# Patient Record
Sex: Female | Born: 1960 | Race: White | Hispanic: No | Smoking: Never smoker
Health system: Southern US, Community
[De-identification: ages and names within clinical notes are randomized; demographics above are authoritative.]

## PROBLEM LIST (undated history)

## (undated) DIAGNOSIS — G43909 Migraine, unspecified, not intractable, without status migrainosus: Secondary | ICD-10-CM

## (undated) DIAGNOSIS — C4491 Basal cell carcinoma of skin, unspecified: Secondary | ICD-10-CM

## (undated) DIAGNOSIS — Z8669 Personal history of other diseases of the nervous system and sense organs: Secondary | ICD-10-CM

## (undated) DIAGNOSIS — E7800 Pure hypercholesterolemia, unspecified: Secondary | ICD-10-CM

## (undated) DIAGNOSIS — B019 Varicella without complication: Secondary | ICD-10-CM

## (undated) DIAGNOSIS — M1990 Unspecified osteoarthritis, unspecified site: Secondary | ICD-10-CM

## (undated) DIAGNOSIS — J309 Allergic rhinitis, unspecified: Secondary | ICD-10-CM

## (undated) DIAGNOSIS — J329 Chronic sinusitis, unspecified: Secondary | ICD-10-CM

## (undated) DIAGNOSIS — T7840XA Allergy, unspecified, initial encounter: Secondary | ICD-10-CM

## (undated) DIAGNOSIS — R569 Unspecified convulsions: Secondary | ICD-10-CM

## (undated) HISTORY — DX: Personal history of other diseases of the nervous system and sense organs: Z86.69

## (undated) HISTORY — DX: Basal cell carcinoma of skin, unspecified: C44.91

## (undated) HISTORY — DX: Unspecified osteoarthritis, unspecified site: M19.90

## (undated) HISTORY — DX: Migraine, unspecified, not intractable, without status migrainosus: G43.909

## (undated) HISTORY — PX: BREAST SURGERY: SHX581

## (undated) HISTORY — DX: Pure hypercholesterolemia, unspecified: E78.00

## (undated) HISTORY — DX: Varicella without complication: B01.9

## (undated) HISTORY — PX: TOTAL ABDOMINAL HYSTERECTOMY W/ BILATERAL SALPINGOOPHORECTOMY: SHX83

## (undated) HISTORY — DX: Allergic rhinitis, unspecified: J30.9

## (undated) HISTORY — PX: ABDOMINAL HYSTERECTOMY: SHX81

## (undated) HISTORY — PX: COSMETIC SURGERY: SHX468

## (undated) HISTORY — DX: Allergy, unspecified, initial encounter: T78.40XA

## (undated) HISTORY — PX: LIPOSUCTION: SHX10

## (undated) HISTORY — DX: Unspecified convulsions: R56.9

## (undated) HISTORY — DX: Chronic sinusitis, unspecified: J32.9

---

## 1984-05-13 HISTORY — PX: BREAST EXCISIONAL BIOPSY: SUR124

## 1991-05-14 HISTORY — PX: SEPTOPLASTY: SUR1290

## 1996-05-13 HISTORY — PX: SKIN CANCER EXCISION: SHX779

## 2004-10-04 ENCOUNTER — Ambulatory Visit: Payer: Self-pay | Admitting: Unknown Physician Specialty

## 2004-10-23 ENCOUNTER — Ambulatory Visit: Payer: Self-pay | Admitting: Unknown Physician Specialty

## 2005-01-25 ENCOUNTER — Other Ambulatory Visit: Payer: Self-pay

## 2005-01-25 ENCOUNTER — Emergency Department: Payer: Self-pay | Admitting: Internal Medicine

## 2006-02-13 ENCOUNTER — Ambulatory Visit: Payer: Self-pay | Admitting: Unknown Physician Specialty

## 2006-02-25 ENCOUNTER — Ambulatory Visit: Payer: Self-pay | Admitting: Internal Medicine

## 2006-05-13 HISTORY — PX: LAPAROSCOPIC CHOLECYSTECTOMY: SUR755

## 2007-09-15 ENCOUNTER — Ambulatory Visit: Payer: Self-pay | Admitting: Unknown Physician Specialty

## 2008-09-06 ENCOUNTER — Ambulatory Visit: Payer: Self-pay | Admitting: Internal Medicine

## 2009-05-31 ENCOUNTER — Ambulatory Visit: Payer: Self-pay | Admitting: Internal Medicine

## 2010-12-04 ENCOUNTER — Ambulatory Visit: Payer: Self-pay | Admitting: Internal Medicine

## 2011-04-15 ENCOUNTER — Ambulatory Visit: Payer: Self-pay | Admitting: Internal Medicine

## 2011-12-05 ENCOUNTER — Ambulatory Visit: Payer: Self-pay | Admitting: Internal Medicine

## 2012-04-03 ENCOUNTER — Encounter: Payer: Self-pay | Admitting: Internal Medicine

## 2012-04-03 ENCOUNTER — Ambulatory Visit (INDEPENDENT_AMBULATORY_CARE_PROVIDER_SITE_OTHER): Payer: BC Managed Care – PPO | Admitting: Internal Medicine

## 2012-04-03 VITALS — BP 129/87 | HR 91 | Temp 98.4°F | Ht 61.5 in | Wt 171.5 lb

## 2012-04-03 DIAGNOSIS — Z23 Encounter for immunization: Secondary | ICD-10-CM

## 2012-04-03 DIAGNOSIS — E7800 Pure hypercholesterolemia, unspecified: Secondary | ICD-10-CM | POA: Insufficient documentation

## 2012-04-03 DIAGNOSIS — R5383 Other fatigue: Secondary | ICD-10-CM

## 2012-04-03 NOTE — Patient Instructions (Signed)
It was nice seeing you today.  I am glad you are doing well.  Let me know if you need anything.  

## 2012-04-04 ENCOUNTER — Encounter: Payer: Self-pay | Admitting: Internal Medicine

## 2012-04-04 NOTE — Assessment & Plan Note (Signed)
Low cholesterol diet and exercise.  Declines to have it checked until her physical.  Declines medication.

## 2012-04-04 NOTE — Progress Notes (Signed)
  Subjective:    Patient ID: Meghan Valdez, female    DOB: Nov 27, 1960, 51 y.o.   MRN: [id number]  HPI 51 year old female with past history of chronic sinusitis, allergic rhinitis and hypercholesterolemia who comes in today for a scheduled follow up.  She states she is doing relatively well.  Still with family stress, but she feels she is handling things relatively well.  Not on any medication.  Only rarely has acid reflux.  Tries to avoid foods that aggravate.  Bowels doing well.  Saw Dr. Ray Church.  Had a recent colonoscopy.  States everything checked out fine.  Blood pressure averaging 120/70.   Past Medical History  Diagnosis Date  . Chronic sinusitis   . Allergic rhinitis   . History of migraine headaches   . Basal cell carcinoma of skin     removed 1998  . Hypercholesterolemia   . Chicken pox   . Migraines     Review of Systems Patient denies any headache, lightheadedness or dizziness.  No significant sinus or allergy symptoms.  No chest pain, tightness or palpitations.  No increased shortness of breath, cough or congestion.  No nausea or vomiting.  No significant acid reflux.   No abdominal pain or cramping.  No bowel change, such as diarrhea, constipation, BRBPR or melana.  No urine change.        Objective:   Physical Exam Filed Vitals:   04/03/12 1127  BP: 129/87  Pulse: 91  Temp: 98.4 F (36.9 C)   Blood pressure recheck:  46/75  51 year old female in no acute distress.   HEENT:  Nares - clear.  OP- without lesions or erythema.  NECK:  Supple, nontender.  No audible bruit.   HEART:  Appears to be regular. LUNGS:  Without crackles or wheezing audible.  Respirations even and unlabored.   RADIAL PULSE:  Equal bilaterally.  ABDOMEN:  Soft, nontender.  No audible abdominal bruit.   EXTREMITIES:  No increased edema to be present.                     Assessment & Plan:  INCREASED PSYCHOSOCIAL STRESSORS.  Feels she is handling things relatively well.  Follow.     HISTORY OF CHRONIC SINUSITIS/ALLERGIC RHINITIS.  Currently asymptomatic.  Follow.    GI.  Saw Dr. Andrey Campanile.  Had her colonoscopy recently (12/2011).  States everything checked out fine.  No acid reflux.  Follow.  {revious EGD 12/21/08 revealed a few gastric polyps.   DERMATOLOGY.  Just saw Standley Dakins.  States bx ok.  PREVIOUS ELEVATED BLOOD PRESSURE.  Checks today as outlined.  Have her spot check her pressure and let me know if elevation.    HEALTH MAINTENANCE.  S/P hysterectomy.  Was followed by Dr Haskel Khan.  Declines pelvics and pap smears.  States she just had her mammogram over the summer.  Obtain results.

## 2012-04-28 ENCOUNTER — Telehealth: Payer: Self-pay | Admitting: *Deleted

## 2012-04-28 ENCOUNTER — Telehealth: Payer: Self-pay | Admitting: Internal Medicine

## 2012-04-28 ENCOUNTER — Ambulatory Visit: Payer: BC Managed Care – PPO | Admitting: Internal Medicine

## 2012-04-28 NOTE — Telephone Encounter (Signed)
Called patient to inform of Dr's recommendation;no answer

## 2012-04-28 NOTE — Telephone Encounter (Signed)
Pt is calling and thinks she broke her foot or her toe. She stumped in really bad last night and can't put any weight on it. I told her you have a 2:45 today but she is not sure if she should weight or if an order could be put in for her to have a x-ray .

## 2012-04-28 NOTE — Telephone Encounter (Signed)
If concern over fracture - would rec acute care for evaluation - they can do xray there.

## 2012-04-28 NOTE — Telephone Encounter (Signed)
Patient going to acute care.

## 2012-06-01 ENCOUNTER — Encounter: Payer: Self-pay | Admitting: Internal Medicine

## 2012-09-08 ENCOUNTER — Ambulatory Visit (INDEPENDENT_AMBULATORY_CARE_PROVIDER_SITE_OTHER): Payer: BC Managed Care – PPO | Admitting: Adult Health

## 2012-09-08 ENCOUNTER — Encounter: Payer: Self-pay | Admitting: Adult Health

## 2012-09-08 VITALS — BP 120/66 | HR 79 | Temp 98.0°F | Resp 14 | Wt 178.5 lb

## 2012-09-08 DIAGNOSIS — L237 Allergic contact dermatitis due to plants, except food: Secondary | ICD-10-CM

## 2012-09-08 DIAGNOSIS — M2560 Stiffness of unspecified joint, not elsewhere classified: Secondary | ICD-10-CM | POA: Insufficient documentation

## 2012-09-08 DIAGNOSIS — L255 Unspecified contact dermatitis due to plants, except food: Secondary | ICD-10-CM

## 2012-09-08 MED ORDER — PREDNISONE 10 MG PO TABS: ORAL_TABLET | ORAL | Status: DC

## 2012-09-08 MED ORDER — DESONIDE CREA-MOISTURIZING LOT 0.05 % EX KIT: 1.0000 "application " | PACK | Freq: Two times a day (BID) | CUTANEOUS | Status: DC

## 2012-09-08 NOTE — Progress Notes (Signed)
  Subjective:    Patient ID: Meghan Valdez, female    DOB: 18-May-1960, 52 y.o.   MRN: [id number]  HPI  Patient was walking through the woods approximately 2 weeks ago. She began to itch and developed blisters. She has been using desonide cream; however, this has an expiration date of 2008. She has also used OTC hydrocortisone cream. Symptoms have not improved.  Review of Systems  Skin: Positive for rash.       Itching on both ankles. Also behind both knees.       Objective:   Physical Exam  Constitutional: She is oriented to person, place, and time. No distress.  Neurological: She is alert and oriented to person, place, and time.  Skin: Rash noted. There is erythema.  Multiple vesicles bilateral medial side of ankles and behind both knees. Area behind knees have spread from contact from opposite ankles.  Psychiatric: She has a normal mood and affect. Her behavior is normal. Judgment and thought content normal.          Assessment & Plan:

## 2012-09-08 NOTE — Patient Instructions (Addendum)
  Apply desonide cream to affected areas twice daily.  Start the prednisone taper today.

## 2012-09-08 NOTE — Assessment & Plan Note (Addendum)
Symptoms ongoing greater than 2 weeks. Secondary lesions from patient scratching. Start prednisone taper and desonide cream. RTC if no resolution of symptoms with steroid taper or if symptoms worsen.

## 2012-10-12 ENCOUNTER — Other Ambulatory Visit: Payer: BC Managed Care – PPO

## 2012-10-16 ENCOUNTER — Encounter: Payer: BC Managed Care – PPO | Admitting: Internal Medicine

## 2012-12-07 ENCOUNTER — Other Ambulatory Visit: Payer: BC Managed Care – PPO

## 2012-12-11 ENCOUNTER — Encounter: Payer: Self-pay | Admitting: Internal Medicine

## 2013-03-18 ENCOUNTER — Other Ambulatory Visit (HOSPITAL_COMMUNITY)
Admission: RE | Admit: 2013-03-18 | Discharge: 2013-03-18 | Disposition: A | Discharge status: Home / Self Care | Payer: BC Managed Care – PPO | Source: Ambulatory Visit | Attending: Internal Medicine | Admitting: Internal Medicine

## 2013-03-18 ENCOUNTER — Ambulatory Visit (INDEPENDENT_AMBULATORY_CARE_PROVIDER_SITE_OTHER): Payer: BC Managed Care – PPO | Admitting: Internal Medicine

## 2013-03-18 ENCOUNTER — Other Ambulatory Visit: Payer: Self-pay

## 2013-03-18 ENCOUNTER — Encounter: Payer: Self-pay | Admitting: Internal Medicine

## 2013-03-18 VITALS — BP 130/80 | HR 83 | Temp 98.5°F | Ht 61.75 in | Wt 174.5 lb

## 2013-03-18 DIAGNOSIS — Z124 Encounter for screening for malignant neoplasm of cervix: Secondary | ICD-10-CM

## 2013-03-18 DIAGNOSIS — E7800 Pure hypercholesterolemia, unspecified: Secondary | ICD-10-CM

## 2013-03-18 DIAGNOSIS — N760 Acute vaginitis: Secondary | ICD-10-CM

## 2013-03-18 DIAGNOSIS — E559 Vitamin D deficiency, unspecified: Secondary | ICD-10-CM

## 2013-03-18 DIAGNOSIS — M2560 Stiffness of unspecified joint, not elsewhere classified: Secondary | ICD-10-CM

## 2013-03-18 DIAGNOSIS — Z23 Encounter for immunization: Secondary | ICD-10-CM

## 2013-03-18 DIAGNOSIS — Z1151 Encounter for screening for human papillomavirus (HPV): Secondary | ICD-10-CM | POA: Insufficient documentation

## 2013-03-18 DIAGNOSIS — Z01419 Encounter for gynecological examination (general) (routine) without abnormal findings: Secondary | ICD-10-CM | POA: Insufficient documentation

## 2013-03-18 DIAGNOSIS — N390 Urinary tract infection, site not specified: Secondary | ICD-10-CM

## 2013-03-18 DIAGNOSIS — R5381 Other malaise: Secondary | ICD-10-CM

## 2013-03-18 DIAGNOSIS — R5383 Other fatigue: Secondary | ICD-10-CM

## 2013-03-18 DIAGNOSIS — Z1239 Encounter for other screening for malignant neoplasm of breast: Secondary | ICD-10-CM

## 2013-03-18 DIAGNOSIS — R350 Frequency of micturition: Secondary | ICD-10-CM

## 2013-03-18 DIAGNOSIS — M2550 Pain in unspecified joint: Secondary | ICD-10-CM

## 2013-03-18 LAB — RHEUMATOID FACTOR: Rheumatoid fact SerPl-aCnc: 10 [IU]/mL

## 2013-03-18 LAB — CBC WITH DIFFERENTIAL/PLATELET
Basophils Absolute: 0 10*3/uL (ref 0.0–0.1)
Basophils Relative: 0.2 % (ref 0.0–3.0)
Eosinophils Absolute: 0.2 10*3/uL (ref 0.0–0.7)
Eosinophils Relative: 3 % (ref 0.0–5.0)
Lymphocytes Relative: 28.4 % (ref 12.0–46.0)
MCHC: 34.1 g/dL (ref 30.0–36.0)
MCV: 88.5 fl (ref 78.0–100.0)
Monocytes Absolute: 0.3 10*3/uL (ref 0.1–1.0)
Neutrophils Relative %: 63.1 % (ref 43.0–77.0)
Platelets: 200 10*3/uL (ref 150.0–400.0)
RBC: 4.29 Mil/uL (ref 3.87–5.11)
RDW: 13 % (ref 11.5–14.6)

## 2013-03-18 LAB — COMPREHENSIVE METABOLIC PANEL
ALT: 27 U/L (ref 0–35)
AST: 23 U/L (ref 0–37)
Albumin: 4.1 g/dL (ref 3.5–5.2)
Alkaline Phosphatase: 93 U/L (ref 39–117)
BUN: 16 mg/dL (ref 6–23)
Chloride: 105 mEq/L (ref 96–112)
Creatinine, Ser: 0.5 mg/dL (ref 0.4–1.2)
Potassium: 3.9 mEq/L (ref 3.5–5.1)

## 2013-03-18 LAB — LIPID PANEL
HDL: 52.6 mg/dL (ref >39.00)
Total CHOL/HDL Ratio: 4
Triglycerides: 138 mg/dL (ref 0.0–149.0)
VLDL: 27.6 mg/dL (ref 0.0–40.0)

## 2013-03-18 LAB — URINALYSIS, ROUTINE W REFLEX MICROSCOPIC
Nitrite: NEGATIVE
Specific Gravity, Urine: >=1.03 (ref 1.000–1.030)
Total Protein, Urine: NEGATIVE
Urine Glucose: NEGATIVE
pH: 6 (ref 5.0–8.0)

## 2013-03-18 LAB — C-REACTIVE PROTEIN: CRP: <0.5 mg/dL (ref 0.5–20.0)

## 2013-03-18 LAB — LDL CHOLESTEROL, DIRECT: Direct LDL: 158.1 mg/dL

## 2013-03-18 NOTE — Progress Notes (Signed)
Pre-visit discussion using our clinic review tool. No additional management support is needed unless otherwise documented below in the visit note.  

## 2013-03-18 NOTE — Progress Notes (Signed)
Subjective:    Patient ID: Meghan Valdez, female    DOB: Jun 09, 1960, 52 y.o.   MRN: [id number]  HPI 52 year old female with past history of chronic sinusitis, allergic rhinitis and hypercholesterolemia who comes in today to follow up on these issues as well as for a complete physical exam.  She states she is doing relatively well.  Family stress is better.   Feels better.  No acid reflux reported today.  Tries to avoid foods that aggravate.  Bowels doing well.  Saw Dr. Ray Church.  Had a recent colonoscopy.  States everything checked out fine.  Blood pressure doing well.  She reports having bilateral hand stiffness and stiffness in her toes.  Also stiffness in other joints.  Has had issues with frequent uti's.  States occurring more frequently now.  Notices some cloudiness and increased frequency.  Some vaginal itching occasionally.     Past Medical History  Diagnosis Date  . Chronic sinusitis   . Allergic rhinitis   . History of migraine headaches   . Basal cell carcinoma of skin     removed 1998  . Hypercholesterolemia   . Chicken pox   . Migraines     Review of Systems Patient denies any headache, lightheadedness or dizziness.  No significant sinus or allergy symptoms.  No chest pain, tightness or palpitations.  No increased shortness of breath, cough or congestion.  No nausea or vomiting.  No significant acid reflux.   No abdominal pain or cramping.  No bowel change, such as diarrhea, constipation, BRBPR or melana.  Some urinary issues as outlined.  Some vaginal itching occasionally.  Some joint stiffness as outlined.  Stress better.        Objective:   Physical Exam  Filed Vitals:   03/18/13 0917  BP: 130/80  Pulse: 83  Temp: 98.5 F (36.9 C)   Blood pressure recheck:  53/64  52 year old female in no acute distress.   HEENT:  Nares- clear.  Oropharynx - without lesions. NECK:  Supple.  Nontender.  No audible bruit.  HEART:  Appears to be regular. LUNGS:  No crackles  or wheezing audible.  Respirations even and unlabored.  RADIAL PULSE:  Equal bilaterally.    BREASTS:  No nipple discharge or nipple retraction present.  Could not appreciate any distinct nodules or axillary adenopathy.  ABDOMEN:  Soft, nontender.  Bowel sounds present and normal.  No audible abdominal bruit.  GU:  Normal external genitalia.  Vaginal vault without lesions.  Pap performed. Could not appreciate any adnexal masses or tenderness.  KOH/wet prep sent.  RECTAL:  Heme negative.   EXTREMITIES:  No increased edema present.  DP pulses palpable and equal bilaterally.            Assessment & Plan:  INCREASED PSYCHOSOCIAL STRESSORS.  Stress better.  Feels she is handling things relatively well.  Follow.    HISTORY OF CHRONIC SINUSITIS/ALLERGIC RHINITIS.  Currently asymptomatic.  Follow.    GI.  Saw Dr. Andrey Campanile.  Had her colonoscopy recently (12/2011).  States everything checked out fine.  No acid reflux.  Follow.  Previous EGD 12/21/08 revealed a few gastric polyps.   DERMATOLOGY.  Sees Standley Dakins.  States bx ok.  PREVIOUS ELEVATED BLOOD PRESSURE.  Checks today as outlined. Blood pressure appears to be doing well.  Follow.     HEALTH MAINTENANCE.  Physical today.  S/P hysterectomy.  Was followed by Dr Haskel Khan.  Pelvic/pap today.  Mammogram 12/05/11 -  Birads II.  Reschedule mammogram.  Colonoscopy as outlined.

## 2013-03-19 LAB — ANA: Anti Nuclear Antibody(ANA): NEGATIVE

## 2013-03-19 LAB — CULTURE, URINE COMPREHENSIVE: Colony Count: NO GROWTH

## 2013-03-20 LAB — WET PREP BY MOLECULAR PROBE: Trichomonas vaginosis: NEGATIVE

## 2013-03-21 ENCOUNTER — Encounter: Payer: Self-pay | Admitting: Internal Medicine

## 2013-03-21 DIAGNOSIS — N390 Urinary tract infection, site not specified: Secondary | ICD-10-CM | POA: Insufficient documentation

## 2013-03-21 DIAGNOSIS — E559 Vitamin D deficiency, unspecified: Secondary | ICD-10-CM | POA: Insufficient documentation

## 2013-03-21 NOTE — Assessment & Plan Note (Signed)
Some vaginal itching.  KOH/wet prep sent.

## 2013-03-21 NOTE — Assessment & Plan Note (Signed)
Will check urinalysis today.  Discussed possible need for urology referral if reoccurring uti's.

## 2013-03-21 NOTE — Assessment & Plan Note (Signed)
Check esr and rheum panel.  Follow.

## 2013-03-21 NOTE — Assessment & Plan Note (Signed)
Low cholesterol diet and exercise.  Declines medication.  Check lipid profile today.   

## 2013-03-21 NOTE — Assessment & Plan Note (Signed)
Continue vitamin D supplements.  Check vitamin D level.  

## 2013-03-23 ENCOUNTER — Telehealth: Payer: Self-pay | Admitting: Internal Medicine

## 2013-03-23 ENCOUNTER — Encounter: Payer: Self-pay | Admitting: Internal Medicine

## 2013-03-23 DIAGNOSIS — R319 Hematuria, unspecified: Secondary | ICD-10-CM

## 2013-03-23 MED ORDER — METRONIDAZOLE 0.75 % VA GEL: 1.0000 | Freq: Two times a day (BID) | VAGINAL | Status: DC

## 2013-03-23 NOTE — Telephone Encounter (Signed)
Pt notified of lab results and need for a f/u urinalysis in 2 weeks.  Please schedule her for a lab appt in 2 weeks and contact her with an appt date and time.  Thanks.

## 2013-03-25 NOTE — Telephone Encounter (Signed)
Appointment 11/25 pt aware °

## 2013-04-06 ENCOUNTER — Other Ambulatory Visit: Payer: BC Managed Care – PPO

## 2013-04-07 ENCOUNTER — Encounter: Payer: Self-pay | Admitting: Emergency Medicine

## 2013-04-15 ENCOUNTER — Encounter: Payer: Self-pay | Admitting: Internal Medicine

## 2013-04-27 ENCOUNTER — Ambulatory Visit: Payer: Self-pay | Admitting: Internal Medicine

## 2013-04-28 ENCOUNTER — Encounter: Payer: Self-pay | Admitting: Internal Medicine

## 2013-07-21 ENCOUNTER — Encounter: Payer: Self-pay | Admitting: Adult Health

## 2013-07-21 ENCOUNTER — Ambulatory Visit (INDEPENDENT_AMBULATORY_CARE_PROVIDER_SITE_OTHER): Payer: BC Managed Care – PPO | Admitting: Adult Health

## 2013-07-21 VITALS — BP 114/72 | HR 78 | Temp 98.1°F | Resp 14 | Wt 175.0 lb

## 2013-07-21 DIAGNOSIS — R3129 Other microscopic hematuria: Secondary | ICD-10-CM

## 2013-07-21 DIAGNOSIS — R300 Dysuria: Secondary | ICD-10-CM

## 2013-07-21 LAB — POCT URINALYSIS DIPSTICK
Bilirubin, UA: NEGATIVE
Glucose, UA: NEGATIVE
KETONES UA: NEGATIVE
NITRITE UA: NEGATIVE
PH UA: 7
Protein, UA: NEGATIVE
Spec Grav, UA: 1.015
Urobilinogen, UA: 0.2

## 2013-07-21 MED ORDER — CIPROFLOXACIN HCL 250 MG PO TABS: 250.0000 mg | ORAL_TABLET | Freq: Two times a day (BID) | ORAL | Status: DC

## 2013-07-21 NOTE — Progress Notes (Signed)
   Subjective:    Patient ID: Meghan Valdez, female    DOB: May 27, 1960, 53 y.o.   MRN: [id number]  HPI  Pt is a 53 y/o female who presents to clinic with dysuria, foul odor of urine, frequency. Symptoms ongoing x 1 week. Denies fever, hematuria.  Past Medical History  Diagnosis Date  . Chronic sinusitis   . Allergic rhinitis   . History of migraine headaches   . Basal cell carcinoma of skin     removed 1998  . Hypercholesterolemia   . Chicken pox   . Migraines      Review of Systems  Constitutional: Negative for fever and chills.  Genitourinary: Positive for dysuria, urgency and frequency. Negative for hematuria and flank pain.  All other systems reviewed and are negative.       Objective:   Physical Exam  Constitutional: She is oriented to person, place, and time. No distress.  Cardiovascular: Normal rate and regular rhythm.   Pulmonary/Chest: Effort normal. No respiratory distress.  Genitourinary:  Pressure with urination. No suprapubic discomfort  Musculoskeletal: Normal range of motion.  Neurological: She is alert and oriented to person, place, and time.  Skin: Skin is warm and dry.  Psychiatric: She has a normal mood and affect. Her behavior is normal. Judgment and thought content normal.       Assessment & Plan:   1. Dysuria UA shows UTI. Send for urine culture. Start Cipro. - POCT Urinalysis Dipstick - Urine Culture  2. Microscopic hematuria UA showing blood. Send for microscopic evaluation for RBCs. If positive will have pt return once treated for UT for re-eval. - Urinalysis, Routine w reflex microscopic

## 2013-07-21 NOTE — Progress Notes (Signed)
Pre visit review using our clinic review tool, if applicable. No additional management support is needed unless otherwise documented below in the visit note. 

## 2013-07-23 LAB — URINE CULTURE

## 2013-07-24 ENCOUNTER — Other Ambulatory Visit: Payer: Self-pay | Admitting: Adult Health

## 2013-07-24 ENCOUNTER — Encounter: Payer: Self-pay | Admitting: Adult Health

## 2013-07-24 DIAGNOSIS — R3129 Other microscopic hematuria: Secondary | ICD-10-CM

## 2013-07-29 ENCOUNTER — Other Ambulatory Visit (INDEPENDENT_AMBULATORY_CARE_PROVIDER_SITE_OTHER): Payer: BC Managed Care – PPO

## 2013-07-29 DIAGNOSIS — R319 Hematuria, unspecified: Secondary | ICD-10-CM

## 2013-07-29 DIAGNOSIS — N390 Urinary tract infection, site not specified: Secondary | ICD-10-CM

## 2013-07-29 LAB — URINALYSIS, ROUTINE W REFLEX MICROSCOPIC
Bilirubin Urine: NEGATIVE
Hgb urine dipstick: NEGATIVE
KETONES UR: NEGATIVE
LEUKOCYTES UA: NEGATIVE
NITRITE: NEGATIVE
PH: 6 (ref 5.0–8.0)
Total Protein, Urine: NEGATIVE
UROBILINOGEN UA: 0.2 (ref 0.0–1.0)
Urine Glucose: NEGATIVE

## 2013-07-30 ENCOUNTER — Encounter: Payer: Self-pay | Admitting: Internal Medicine

## 2013-09-30 ENCOUNTER — Encounter: Payer: Self-pay | Admitting: Internal Medicine

## 2013-09-30 ENCOUNTER — Ambulatory Visit (INDEPENDENT_AMBULATORY_CARE_PROVIDER_SITE_OTHER): Payer: BC Managed Care – PPO | Admitting: Internal Medicine

## 2013-09-30 VITALS — BP 130/80 | HR 81 | Temp 98.0°F | Ht 61.75 in | Wt 175.5 lb

## 2013-09-30 DIAGNOSIS — M2560 Stiffness of unspecified joint, not elsewhere classified: Secondary | ICD-10-CM

## 2013-09-30 DIAGNOSIS — E559 Vitamin D deficiency, unspecified: Secondary | ICD-10-CM

## 2013-09-30 DIAGNOSIS — R079 Chest pain, unspecified: Secondary | ICD-10-CM

## 2013-09-30 DIAGNOSIS — E7800 Pure hypercholesterolemia, unspecified: Secondary | ICD-10-CM

## 2013-09-30 LAB — COMPREHENSIVE METABOLIC PANEL
ALT: 28 U/L (ref 0–35)
AST: 27 U/L (ref 0–37)
Albumin: 3.8 g/dL (ref 3.5–5.2)
Alkaline Phosphatase: 94 U/L (ref 39–117)
BUN: 10 mg/dL (ref 6–23)
CHLORIDE: 104 meq/L (ref 96–112)
CO2: 29 mEq/L (ref 19–32)
CREATININE: 0.6 mg/dL (ref 0.4–1.2)
Calcium: 9.5 mg/dL (ref 8.4–10.5)
GFR: 102.95 mL/min (ref >60.00)
Glucose, Bld: 87 mg/dL (ref 70–99)
Potassium: 4.1 mEq/L (ref 3.5–5.1)
SODIUM: 140 meq/L (ref 135–145)
TOTAL PROTEIN: 6.5 g/dL (ref 6.0–8.3)
Total Bilirubin: 0.8 mg/dL (ref 0.2–1.2)

## 2013-09-30 LAB — LIPID PANEL
Cholesterol: 223 mg/dL — ABNORMAL HIGH (ref 0–200)
HDL: 56.3 mg/dL (ref >39.00)
LDL Cholesterol: 144 mg/dL — ABNORMAL HIGH (ref 0–99)
TRIGLYCERIDES: 116 mg/dL (ref 0.0–149.0)
Total CHOL/HDL Ratio: 4
VLDL: 23.2 mg/dL (ref 0.0–40.0)

## 2013-09-30 LAB — CBC WITH DIFFERENTIAL/PLATELET
BASOS ABS: 0 10*3/uL (ref 0.0–0.1)
Basophils Relative: 0.2 % (ref 0.0–3.0)
Eosinophils Absolute: 0.1 10*3/uL (ref 0.0–0.7)
Eosinophils Relative: 2.4 % (ref 0.0–5.0)
HCT: 38.6 % (ref 36.0–46.0)
HEMOGLOBIN: 13 g/dL (ref 12.0–15.0)
LYMPHS PCT: 25.1 % (ref 12.0–46.0)
Lymphs Abs: 1.3 10*3/uL (ref 0.7–4.0)
MCHC: 33.8 g/dL (ref 30.0–36.0)
MCV: 90.4 fl (ref 78.0–100.0)
Monocytes Absolute: 0.2 10*3/uL (ref 0.1–1.0)
Monocytes Relative: 4.2 % (ref 3.0–12.0)
NEUTROS ABS: 3.5 10*3/uL (ref 1.4–7.7)
Neutrophils Relative %: 68.1 % (ref 43.0–77.0)
PLATELETS: 208 10*3/uL (ref 150.0–400.0)
RBC: 4.27 Mil/uL (ref 3.87–5.11)
RDW: 13 % (ref 11.5–15.5)
WBC: 5.2 10*3/uL (ref 4.0–10.5)

## 2013-09-30 LAB — TSH: TSH: 1.58 u[IU]/mL (ref 0.35–4.50)

## 2013-09-30 NOTE — Progress Notes (Signed)
Pre visit review using our clinic review tool, if applicable. No additional management support is needed unless otherwise documented below in the visit note. 

## 2013-10-01 LAB — VITAMIN D 25 HYDROXY (VIT D DEFICIENCY, FRACTURES): Vit D, 25-Hydroxy: 30 ng/mL (ref 30–89)

## 2013-10-04 ENCOUNTER — Encounter: Payer: Self-pay | Admitting: Internal Medicine

## 2013-10-04 DIAGNOSIS — R079 Chest pain, unspecified: Secondary | ICD-10-CM | POA: Insufficient documentation

## 2013-10-04 NOTE — Assessment & Plan Note (Signed)
Low cholesterol diet and exercise.  Declines medication.  Check lipid profile today.

## 2013-10-04 NOTE — Progress Notes (Signed)
Subjective:    Patient ID: [place] Abdulla, female    DOB: Mar 26, 1961, 53 y.o.   MRN: [id number]  HPI 53 year old female with past history of chronic sinusitis, allergic rhinitis and hypercholesterolemia who comes in today for a scheduled follow up.   She states she is doing relatively well.  Dealing with some family stress.  She reports this has increased lately.  She feels she is handling things relatively well.  Does not feel she needs any further intervention at this time.   No acid reflux reported today.  Tries to avoid foods that aggravate.  Bowels doing well.  Saw Dr. Evalee Mutton.  Had a recent colonoscopy.  States everything checked out fine.  Blood pressure doing well.  She does report noticing some intermittent chest pain.  Described as sharp pain.  Localized pressure.  No known triggers.  Last only briefly.  May occur once every few weeks.  Not brought on by increased activity or exertion.      Past Medical History  Diagnosis Date  . Chronic sinusitis   . Allergic rhinitis   . History of migraine headaches   . Basal cell carcinoma of skin     removed 1998  . Hypercholesterolemia   . Chicken pox   . Migraines     Outpatient Encounter Prescriptions as of 09/30/2013  Medication Sig  . fluticasone (FLONASE) 50 MCG/ACT nasal spray Place 2 sprays into both nostrils daily.  . [DISCONTINUED] ciprofloxacin (CIPRO) 250 MG tablet Take 1 tablet (250 mg total) by mouth 2 (two) times daily.    Review of Systems Patient denies any headache, lightheadedness or dizziness.  No significant sinus or allergy symptoms.  No chest pain, tightness or palpitations.  No increased shortness of breath, cough or congestion.  No nausea or vomiting.  No significant acid reflux.   No abdominal pain or cramping.  No bowel change, such as diarrhea, constipation, BRBPR or melana.  No urinary symptoms or vaginal symptoms reported.  Increased stress as outlined.         Objective:   Physical Exam  Filed  Vitals:   09/30/13 0856  BP: 130/80  Pulse: 81  Temp: 98 F (36.7 C)   Blood pressure recheck:  57/71  53 year old female in no acute distress.   HEENT:  Nares- clear.  Oropharynx - without lesions. NECK:  Supple.  Nontender.  No audible bruit.  HEART:  Appears to be regular. LUNGS:  No crackles or wheezing audible.  Respirations even and unlabored.  RADIAL PULSE:  Equal bilaterally.    BREASTS:  No nipple discharge or nipple retraction present.  Could not appreciate any distinct nodules or axillary adenopathy.  ABDOMEN:  Soft, nontender.  Bowel sounds present and normal.  No audible abdominal bruit.  GU:  Not performed.   EXTREMITIES:  No increased edema present.  DP pulses palpable and equal bilaterally.            Assessment & Plan:  HISTORY OF CHRONIC SINUSITIS/ALLERGIC RHINITIS.  Currently asymptomatic.  Follow.  Flonase prn.   GI.  Saw Dr. Redmond Pulling.  Had her colonoscopy recently (12/2011).  States everything checked out fine.  No acid reflux.  Follow.  Previous EGD 12/21/08 revealed a few gastric polyps.   DERMATOLOGY.  Sees Manya Silvas.    PREVIOUS ELEVATED BLOOD PRESSURE.  Checks today as outlined. Blood pressure appears to be doing well.  Follow.     HEALTH MAINTENANCE.  Physical 03/18/13.  S/P hysterectomy.  Was followed by Dr Vernie Ammons.  Pelvic/pap 03/18/13 negative with negative HPV.  Mammogram 04/27/13 - Birads-I.  Colonoscopy as outlined.      I spent 25 minutes with the patient and more than 50% of the time was spent in consultation regarding the above.

## 2013-10-04 NOTE — Assessment & Plan Note (Signed)
Chest pain as outlined.  No specific triggers.  Does not appear to occur with increased activity or exertion.  EKG revealed SR with no acute ischemic changes.  Discussed with her regarding further cardiac w/up including stress test, etc.  She declines.  Will follow.  Notify me or be reevaluated if symptoms change or worsen or if she changes her mind.

## 2013-10-04 NOTE — Assessment & Plan Note (Signed)
Not reported as a significant issue today.  Follow.

## 2013-10-04 NOTE — Assessment & Plan Note (Signed)
Continue vitamin D supplements.  Check vitamin D level.  

## 2013-10-07 ENCOUNTER — Encounter: Payer: Self-pay | Admitting: Internal Medicine

## 2013-10-11 NOTE — Telephone Encounter (Signed)
Unread mychart message mailed to patient 

## 2014-01-04 ENCOUNTER — Encounter: Payer: Self-pay | Admitting: Internal Medicine

## 2014-01-04 ENCOUNTER — Ambulatory Visit (INDEPENDENT_AMBULATORY_CARE_PROVIDER_SITE_OTHER): Payer: BC Managed Care – PPO | Admitting: Internal Medicine

## 2014-01-04 VITALS — BP 110/80 | HR 85 | Temp 98.2°F | Ht 61.75 in | Wt 174.2 lb

## 2014-01-04 DIAGNOSIS — R350 Frequency of micturition: Secondary | ICD-10-CM

## 2014-01-04 DIAGNOSIS — N3000 Acute cystitis without hematuria: Secondary | ICD-10-CM

## 2014-01-04 LAB — POCT URINALYSIS DIPSTICK
Bilirubin, UA: NEGATIVE
Glucose, UA: NEGATIVE
Ketones, UA: NEGATIVE
Nitrite, UA: NEGATIVE
PH UA: 6
PROTEIN UA: NEGATIVE
SPEC GRAV UA: 1.02
UROBILINOGEN UA: 0.2

## 2014-01-04 MED ORDER — CIPROFLOXACIN HCL 500 MG PO TABS: 500.0000 mg | ORAL_TABLET | Freq: Two times a day (BID) | ORAL | Status: DC

## 2014-01-04 NOTE — Progress Notes (Signed)
Pre visit review using our clinic review tool, if applicable. No additional management support is needed unless otherwise documented below in the visit note. 

## 2014-01-06 ENCOUNTER — Encounter: Payer: Self-pay | Admitting: Internal Medicine

## 2014-01-06 DIAGNOSIS — N390 Urinary tract infection, site not specified: Secondary | ICD-10-CM | POA: Insufficient documentation

## 2014-01-06 NOTE — Progress Notes (Signed)
  Subjective:    Patient ID: Meghan Valdez, female    DOB: 1960-07-15, 53 y.o.   MRN: [id number]  Urinary Tract Infection   53 year old female with past history of chronic sinusitis, allergic rhinitis and hypercholesterolemia who comes in today as a work in with concern regarding a possible urinary tract infection.  She states she has been doing well.  Stress better.  Started with some nocturia for a few days.  Noticed some dysuria this am.  No hematuria.  No fever or chills.  No nausea or vomiting.  Cloudy urine.  Urine smelled "bad".  No abdominal pain.       Past Medical History  Diagnosis Date  . Chronic sinusitis   . Allergic rhinitis   . History of migraine headaches   . Basal cell carcinoma of skin     removed 1998  . Hypercholesterolemia   . Chicken pox   . Migraines     Outpatient Encounter Prescriptions as of 01/04/2014  Medication Sig  . ciprofloxacin (CIPRO) 500 MG tablet Take 1 tablet (500 mg total) by mouth 2 (two) times daily.  . fluticasone (FLONASE) 50 MCG/ACT nasal spray Place 2 sprays into both nostrils daily.    Review of Systems No fever.  No nausea or vomiting.  No abdominal pain or cramping.  Urinary symptoms as outlined.  Eating and drinking ok.      Objective:   Physical Exam  Filed Vitals:   01/04/14 1036  BP: 110/80  Pulse: 85  Temp: 98.2 F (46.69 C)   53 year old female in no acute distress.  HEART:  Appears to be regular. LUNGS:  No crackles or wheezing audible.  Respirations even and unlabored.  ABDOMEN:  Soft, nontender.  Bowel sounds present and normal.    BACK:  Non tender.  No CVA tenderness.             Assessment & Plan:

## 2014-01-06 NOTE — Assessment & Plan Note (Signed)
Symptoms and urine dip c/w uti.  Treat with cipro as directed.  Fluids.  Send for culture.

## 2014-01-07 ENCOUNTER — Other Ambulatory Visit: Payer: Self-pay | Admitting: Internal Medicine

## 2014-01-07 DIAGNOSIS — R319 Hematuria, unspecified: Secondary | ICD-10-CM

## 2014-01-07 LAB — CULTURE, URINE COMPREHENSIVE: Colony Count: >=100000

## 2014-01-07 NOTE — Progress Notes (Signed)
Order placed for f/u urinalysis 

## 2014-01-21 ENCOUNTER — Other Ambulatory Visit (INDEPENDENT_AMBULATORY_CARE_PROVIDER_SITE_OTHER): Payer: BC Managed Care – PPO

## 2014-01-21 DIAGNOSIS — R319 Hematuria, unspecified: Secondary | ICD-10-CM

## 2014-01-21 LAB — URINALYSIS, ROUTINE W REFLEX MICROSCOPIC
Bilirubin Urine: NEGATIVE
Hgb urine dipstick: NEGATIVE
Ketones, ur: NEGATIVE
Nitrite: NEGATIVE
RBC / HPF: NONE SEEN
Specific Gravity, Urine: <=1.005 — AB
Total Protein, Urine: NEGATIVE
Urine Glucose: NEGATIVE
Urobilinogen, UA: 0.2
pH: 6.5 (ref 5.0–8.0)

## 2014-01-21 NOTE — Addendum Note (Signed)
Addended by: Johnsie Cancel on: 01/21/2014 11:38 AM   Modules accepted: Orders

## 2014-01-22 ENCOUNTER — Encounter: Payer: Self-pay | Admitting: Internal Medicine

## 2014-04-01 ENCOUNTER — Encounter: Payer: Self-pay | Admitting: Internal Medicine

## 2014-04-01 ENCOUNTER — Ambulatory Visit (INDEPENDENT_AMBULATORY_CARE_PROVIDER_SITE_OTHER): Payer: BC Managed Care – PPO | Admitting: Internal Medicine

## 2014-04-01 VITALS — BP 130/80 | HR 90 | Temp 98.2°F | Ht 61.25 in | Wt 174.5 lb

## 2014-04-01 DIAGNOSIS — E7800 Pure hypercholesterolemia, unspecified: Secondary | ICD-10-CM

## 2014-04-01 DIAGNOSIS — Z1239 Encounter for other screening for malignant neoplasm of breast: Secondary | ICD-10-CM

## 2014-04-01 DIAGNOSIS — E559 Vitamin D deficiency, unspecified: Secondary | ICD-10-CM

## 2014-04-01 DIAGNOSIS — R42 Dizziness and giddiness: Secondary | ICD-10-CM

## 2014-04-01 LAB — COMPREHENSIVE METABOLIC PANEL
ALT: 19 U/L (ref 0–35)
AST: 17 U/L (ref 0–37)
Albumin: 4 g/dL (ref 3.5–5.2)
Alkaline Phosphatase: 95 U/L (ref 39–117)
BUN: 20 mg/dL (ref 6–23)
CALCIUM: 9.4 mg/dL (ref 8.4–10.5)
CHLORIDE: 106 meq/L (ref 96–112)
CO2: 28 meq/L (ref 19–32)
Creatinine, Ser: 0.8 mg/dL (ref 0.4–1.2)
GFR: 84.27 mL/min (ref >60.00)
GLUCOSE: 94 mg/dL (ref 70–99)
Potassium: 4.9 mEq/L (ref 3.5–5.1)
Sodium: 141 mEq/L (ref 135–145)
TOTAL PROTEIN: 6.8 g/dL (ref 6.0–8.3)
Total Bilirubin: 0.8 mg/dL (ref 0.2–1.2)

## 2014-04-01 LAB — LIPID PANEL
CHOLESTEROL: 219 mg/dL — AB (ref 0–200)
HDL: 56 mg/dL (ref >39.00)
LDL Cholesterol: 147 mg/dL — ABNORMAL HIGH (ref 0–99)
NONHDL: 163
Total CHOL/HDL Ratio: 4
Triglycerides: 81 mg/dL (ref 0.0–149.0)
VLDL: 16.2 mg/dL (ref 0.0–40.0)

## 2014-04-01 NOTE — Progress Notes (Signed)
Pre visit review using our clinic review tool, if applicable. No additional management support is needed unless otherwise documented below in the visit note. 

## 2014-04-01 NOTE — Progress Notes (Signed)
Subjective:    Patient ID: Meghan Valdez, female    DOB: [birth year], 53 y.o.   MRN: [id number]  HPI 53 year old female with past history of chronic sinusitis, allergic rhinitis and hypercholesterolemia who comes in today to follow up on these issues as well as for a complete physical exam.   She states she is doing relatively well. Stress is better.  She feels she is handling things relatively well.   No acid reflux reported today.  Bowels doing well.  Saw Dr. Evalee Mutton.  Had a recent colonoscopy.  States everything checked out fine.  Blood pressure doing well.  No chest pain or tightness.  No sob.  Does report some light headedness.  Intermittent.  Brief.  Not frequent.  Notices more when at [facility].  No headache associated.  No sinus congestion or drainage.      Past Medical History  Diagnosis Date  . Chronic sinusitis   . Allergic rhinitis   . History of migraine headaches   . Basal cell carcinoma of skin     removed 1998  . Hypercholesterolemia   . Chicken pox   . Migraines     Outpatient Encounter Prescriptions as of 04/01/2014  Medication Sig  . [DISCONTINUED] ciprofloxacin (CIPRO) 500 MG tablet Take 1 tablet (500 mg total) by mouth 2 (two) times daily.  . [DISCONTINUED] fluticasone (FLONASE) 50 MCG/ACT nasal spray Place 2 sprays into both nostrils daily.    Review of Systems Patient denies any headache.  Light headedness as outlined.    No significant sinus or allergy symptoms.  No chest pain, tightness or palpitations.  No increased shortness of breath, cough or congestion.  No nausea or vomiting.  No significant acid reflux.   No abdominal pain or cramping.  No bowel change, such as diarrhea, constipation, BRBPR or melana.  No urinary symptoms or vaginal symptoms reported.  Stress is better.  Doing better.  Overall feels good.        Objective:   Physical Exam  Filed Vitals:   04/01/14 0838  BP: 130/80  Pulse: 90  Temp: 98.2 F (42.33 C)   53 year old female in  no acute distress.   HEENT:  Nares- clear.  Oropharynx - without lesions. NECK:  Supple.  Nontender.  No audible bruit.  HEART:  Appears to be regular. LUNGS:  No crackles or wheezing audible.  Respirations even and unlabored.  RADIAL PULSE:  Equal bilaterally.    BREASTS:  No nipple discharge or nipple retraction present.  Could not appreciate any distinct nodules or axillary adenopathy.  ABDOMEN:  Soft, nontender.  Bowel sounds present and normal.  No audible abdominal bruit.  GU:  Pt declined.    EXTREMITIES:  No increased edema present.  DP pulses palpable and equal bilaterally.           Assessment & Plan:  HISTORY OF CHRONIC SINUSITIS/ALLERGIC RHINITIS.  Currently asymptomatic.  Follow.  Flonase prn.   GI.  Saw Dr. Redmond Pulling.  Had her colonoscopy recently (12/2011).  States everything checked out fine.  No acid reflux.  Follow.  Previous EGD 12/21/08 revealed a few gastric polyps.   DERMATOLOGY.  Sees Manya Silvas.    PREVIOUS ELEVATED BLOOD PRESSURE.  Checks today as outlined. Blood pressure appears to be doing well.  Follow.   Hypercholesterolemia Low cholesterol diet and exercise.  She declines cholesterol medication.   - Lipid panel - Comprehensive metabolic panel  Vitamin D deficiency Continue supplements.  Follow  vitamin D.   Breast cancer screening - MM DIGITAL SCREENING BILATERAL; Future  Light headedness Unclear etiology.  No acute abnormality noted on exam.  Discussed further w/up.  She declines.  Follow for triggers.      HEALTH MAINTENANCE.  Physical today.  S/P hysterectomy.  Was followed by Dr Vernie Ammons.  Pelvic/pap 03/18/13 negative with negative HPV.  Mammogram 04/27/13 - Birads-I.  Schedule f/u mammogram.  Colonoscopy as outlined.      I spent 25 minutes with the patient and more than 50% of the time was spent in consultation regarding the above.

## 2014-04-02 ENCOUNTER — Encounter: Payer: Self-pay | Admitting: Internal Medicine

## 2014-04-03 ENCOUNTER — Encounter: Payer: Self-pay | Admitting: Internal Medicine

## 2014-04-03 DIAGNOSIS — R42 Dizziness and giddiness: Secondary | ICD-10-CM | POA: Insufficient documentation

## 2014-05-02 ENCOUNTER — Ambulatory Visit: Payer: Self-pay | Admitting: Internal Medicine

## 2014-05-02 LAB — HM MAMMOGRAPHY: HM MAMMO: NEGATIVE

## 2014-05-03 ENCOUNTER — Encounter: Payer: Self-pay | Admitting: Internal Medicine

## 2014-05-22 ENCOUNTER — Encounter: Payer: Self-pay | Admitting: Internal Medicine

## 2014-05-23 ENCOUNTER — Ambulatory Visit (INDEPENDENT_AMBULATORY_CARE_PROVIDER_SITE_OTHER): Payer: 59 | Admitting: Internal Medicine

## 2014-05-23 ENCOUNTER — Encounter: Payer: Self-pay | Admitting: Internal Medicine

## 2014-05-23 VITALS — BP 126/78 | HR 107 | Temp 98.5°F | Resp 16 | Ht 61.0 in | Wt 172.8 lb

## 2014-05-23 DIAGNOSIS — R070 Pain in throat: Secondary | ICD-10-CM

## 2014-05-23 DIAGNOSIS — J029 Acute pharyngitis, unspecified: Secondary | ICD-10-CM

## 2014-05-23 LAB — POCT RAPID STREP A (OFFICE): Rapid Strep A Screen: NEGATIVE

## 2014-05-23 MED ORDER — FIRST-DUKES MOUTHWASH MT SUSP: OROMUCOSAL | Status: DC

## 2014-05-23 MED ORDER — AMOXICILLIN 875 MG PO TABS: 875.0000 mg | ORAL_TABLET | Freq: Two times a day (BID) | ORAL | Status: DC

## 2014-05-25 LAB — CULTURE, GROUP A STREP: Organism ID, Bacteria: NORMAL

## 2014-05-26 ENCOUNTER — Encounter: Payer: Self-pay | Admitting: Internal Medicine

## 2014-05-28 ENCOUNTER — Encounter: Payer: Self-pay | Admitting: Internal Medicine

## 2014-05-28 DIAGNOSIS — J029 Acute pharyngitis, unspecified: Secondary | ICD-10-CM | POA: Insufficient documentation

## 2014-05-28 NOTE — Progress Notes (Signed)
   Subjective:    Patient ID: Meghan Valdez, female    DOB: 1961/03/13, 54 y.o.   MRN: [id number]  HPI 54 year old female with past history of chronic sinusitis, allergic rhinitis and hypercholesterolemia who comes in today as a work in with concerns regarding a sore throat.  States symptoms started 5 days ago.  Increased drainage.  No sinue congestion.  No chest congestion.  No sob.  No vomiting or diarrhea.  Increased sore throat.  Hurts to swallow.  Throat red and irritated.  Previous "white spots".  No fever.     Past Medical History  Diagnosis Date  . Chronic sinusitis   . Allergic rhinitis   . History of migraine headaches   . Basal cell carcinoma of skin     removed 1998  . Hypercholesterolemia   . Chicken pox   . Migraines     Review of Systems Patient denies any headache, lightheadedness or dizziness.  No chest pain, tightness or palpitations.  No increased shortness of breath, cough or congestion.  Sore throat as outlined.  Hurst to swallow.  No feverl.  No nausea or vomiting.  No abdominal pain or cramping.  No bowel change, such as diarrhea.        Objective:   Physical Exam Filed Vitals:   05/23/14 1319  BP: 126/78  Pulse: 107  Temp: 98.5 F (36.9 C)  Resp: 76   54 year old female in no acute distress.   HEENT:  Nares- clear.  Oropharynx - increased erythema posterior oropharynx.   NECK:  Supple.  Minimal tenderness to palpation.   HEART:  Appears to be regular. LUNGS:  No crackles or wheezing audible.  Respirations even and unlabored.       Assessment & Plan:  1. Throat pain in adult - POCT rapid strep A - Throat culture ([facility])  2. Acute pharyngitis, unspecified pharyngitis type Exam as outlined.  Rapid strep negative.  Still concern over strep.  Will treat with amoxicillin as directed.  [facility] mouthwash as directed.  Follow.

## 2014-06-07 ENCOUNTER — Encounter: Payer: Self-pay | Admitting: Internal Medicine

## 2014-06-08 ENCOUNTER — Ambulatory Visit (INDEPENDENT_AMBULATORY_CARE_PROVIDER_SITE_OTHER): Payer: 59 | Admitting: Nurse Practitioner

## 2014-06-08 ENCOUNTER — Encounter: Payer: Self-pay | Admitting: Nurse Practitioner

## 2014-06-08 VITALS — BP 106/62 | HR 96 | Temp 97.8°F | Resp 12 | Ht 61.0 in | Wt 172.1 lb

## 2014-06-08 DIAGNOSIS — Z139 Encounter for screening, unspecified: Secondary | ICD-10-CM

## 2014-06-08 DIAGNOSIS — N390 Urinary tract infection, site not specified: Secondary | ICD-10-CM

## 2014-06-08 LAB — POCT URINALYSIS DIPSTICK
Bilirubin, UA: NEGATIVE
Blood, UA: NEGATIVE
Glucose, UA: NEGATIVE
KETONES UA: NEGATIVE
Nitrite, UA: NEGATIVE
PROTEIN UA: NEGATIVE
Spec Grav, UA: 1.015
UROBILINOGEN UA: 0.2
pH, UA: 7

## 2014-06-08 NOTE — Progress Notes (Signed)
   Subjective:    Patient ID: Meghan Valdez, female    DOB: 05-01-1961, 54 y.o.   MRN: [id number]  HPI  [name] is a 54 yo female with a CC of urinary frequency and dysuria x 4 days.   1) Pressure on bladder, urgency and burning onset 4 days.  Stream was slow and not emptying bladder  Worst last night, feeling better today Just finished amoxicillin 10 day course for possible strep (results were negative).   Azo tablets- helpful  Cranberry- low sugar variety   Review of Systems  Constitutional: Negative for fever, chills, diaphoresis and fatigue.  Respiratory: Negative for chest tightness, shortness of breath and wheezing.   Cardiovascular: Negative for chest pain, palpitations and leg swelling.  Gastrointestinal: Negative for nausea, vomiting and diarrhea.  Genitourinary: Positive for dysuria, urgency, frequency, decreased urine volume and difficulty urinating. Negative for hematuria, flank pain and vaginal discharge.  Skin: Negative for rash.  Neurological: Negative for dizziness, weakness, numbness and headaches.  Psychiatric/Behavioral: The patient is not nervous/anxious.        Objective:   Physical Exam  Constitutional: She is oriented to person, place, and time. She appears well-developed and well-nourished. No distress.  BP 106/62 mmHg  Pulse 96  Temp(Src) 97.8 F (36.6 C) (Oral)  Resp 12  Ht 5\' 1"  (1.549 m)  Wt 172 lb 1.9 oz (78.073 kg)  BMI 32.54 kg/m2  SpO2 97%   HENT:  Head: Normocephalic and atraumatic.  Cardiovascular: Normal rate and regular rhythm.   Pulmonary/Chest: Effort normal and breath sounds normal.  Abdominal: Soft. She exhibits no distension. There is no tenderness. There is no CVA tenderness.  Neurological: She is alert and oriented to person, place, and time.  Skin: Skin is warm and dry. No rash noted. She is not diaphoretic.  Psychiatric: She has a normal mood and affect. Her behavior is normal. Judgment and thought content normal.        Assessment & Plan:

## 2014-06-08 NOTE — Assessment & Plan Note (Signed)
Improving. POCT urine shows trace leukocytes. Will send for culture. Will send abx if culture comes back with organism growth.

## 2014-06-08 NOTE — Patient Instructions (Signed)
We will contact you with your results via MyChart.   Thanks!

## 2014-06-08 NOTE — Progress Notes (Signed)
Pre visit review using our clinic review tool, if applicable. No additional management support is needed unless otherwise documented below in the visit note. 

## 2014-06-10 LAB — URINE CULTURE

## 2014-06-11 ENCOUNTER — Encounter: Payer: Self-pay | Admitting: Internal Medicine

## 2014-06-12 ENCOUNTER — Encounter: Payer: Self-pay | Admitting: Internal Medicine

## 2014-06-12 ENCOUNTER — Other Ambulatory Visit: Payer: Self-pay | Admitting: Internal Medicine

## 2014-06-12 MED ORDER — CIPROFLOXACIN HCL 500 MG PO TABS: 500.0000 mg | ORAL_TABLET | Freq: Two times a day (BID) | ORAL | Status: DC

## 2014-06-12 NOTE — Progress Notes (Signed)
cipro rx sent in to [facility].

## 2014-06-13 NOTE — Telephone Encounter (Signed)
See result note 06/12/14, pt has been notified and new Rx sent.

## 2014-08-05 ENCOUNTER — Ambulatory Visit: Payer: BC Managed Care – PPO | Admitting: Internal Medicine

## 2014-08-23 ENCOUNTER — Encounter: Payer: Self-pay | Admitting: Internal Medicine

## 2014-08-23 ENCOUNTER — Ambulatory Visit (INDEPENDENT_AMBULATORY_CARE_PROVIDER_SITE_OTHER): Payer: 59 | Admitting: Internal Medicine

## 2014-08-23 VITALS — BP 120/80 | HR 78 | Temp 98.1°F | Ht 61.0 in | Wt 173.0 lb

## 2014-08-23 DIAGNOSIS — E559 Vitamin D deficiency, unspecified: Secondary | ICD-10-CM

## 2014-08-23 DIAGNOSIS — Z0000 Encounter for general adult medical examination without abnormal findings: Secondary | ICD-10-CM

## 2014-08-23 DIAGNOSIS — E7800 Pure hypercholesterolemia, unspecified: Secondary | ICD-10-CM

## 2014-08-23 DIAGNOSIS — R42 Dizziness and giddiness: Secondary | ICD-10-CM | POA: Diagnosis not present

## 2014-08-23 DIAGNOSIS — K219 Gastro-esophageal reflux disease without esophagitis: Secondary | ICD-10-CM

## 2014-08-23 DIAGNOSIS — N390 Urinary tract infection, site not specified: Secondary | ICD-10-CM

## 2014-08-23 NOTE — Progress Notes (Signed)
Pre visit review using our clinic review tool, if applicable. No additional management support is needed unless otherwise documented below in the visit note. 

## 2014-08-23 NOTE — Assessment & Plan Note (Signed)
Reflux symptoms as outlined.  Restart prilosec 20mg  q day.  Get her back in soon to reassess.  If persistent problems will need a f/u EGD.  Last 2010.

## 2014-08-23 NOTE — Patient Instructions (Signed)
prilosec 20mg  - take one 30 minutes before breakfast.

## 2014-08-23 NOTE — Progress Notes (Signed)
Patient ID: Meghan Valdez, female   DOB: 12-Aug-1960, 54 y.o.   MRN: [id number]   Subjective:    Patient ID: Meghan Valdez, female    DOB: [birth year], 54 y.o.   MRN: [id number]  HPI  Patient here for a scheduled follow up.  States she is doing well.  Stress is better.  She feels she is coping relatively well.  No nausea or vomiting.  She has noticed some increased acid reflux recently.  Taking TUMS.  On no PPI.  Bowels stable.  Only had a couple of episodes of light headedness.  States occurred when she was hot and dehydrated.  No other episodes.     Past Medical History  Diagnosis Date  . Chronic sinusitis   . Allergic rhinitis   . History of migraine headaches   . Basal cell carcinoma of skin     removed 1998  . Hypercholesterolemia   . Chicken pox   . Migraines     Outpatient Encounter Prescriptions as of 08/23/2014  Medication Sig  . fluticasone (FLONASE) 50 MCG/ACT nasal spray Place 2 sprays into both nostrils daily.  . [DISCONTINUED] ciprofloxacin (CIPRO) 500 MG tablet Take 1 tablet (500 mg total) by mouth 2 (two) times daily.    Review of Systems  Constitutional: Negative for appetite change and unexpected weight change.  HENT: Negative for sinus pressure.        Started with some allergy symptoms.  Started flonase.  Working well for her.    Respiratory: Negative for cough, chest tightness and shortness of breath.   Cardiovascular: Negative for chest pain, palpitations and leg swelling.  Gastrointestinal: Negative for nausea, vomiting, abdominal pain and diarrhea.       Does report some acid reflux.    Genitourinary: Negative for dysuria and difficulty urinating.  Neurological: Negative for dizziness, light-headedness (doing well.  only had the episodes as outlined.  ) and headaches.  Psychiatric/Behavioral: Negative for dysphoric mood and agitation.       Objective:     Blood pressure recheck:  130/82  Physical Exam  Constitutional: She appears  well-developed and well-nourished. No distress.  HENT:  Nose: Nose normal.  Mouth/Throat: Oropharynx is clear and moist.  Neck: Neck supple. No thyromegaly present.  Cardiovascular: Normal rate and regular rhythm.   Pulmonary/Chest: Breath sounds normal. No respiratory distress. She has no wheezes.  Abdominal: Soft. Bowel sounds are normal. There is no tenderness.  Musculoskeletal: She exhibits no edema or tenderness.  Lymphadenopathy:    She has no cervical adenopathy.  Psychiatric: She has a normal mood and affect. Her behavior is normal.    BP 120/80 mmHg  Pulse 78  Temp(Src) 98.1 F (36.7 C) (Oral)  Ht 5\' 1"  (1.549 m)  Wt 173 lb (78.472 kg)  BMI 32.70 kg/m2  SpO2 95% Wt Readings from Last 3 Encounters:  08/23/14 173 lb (78.472 kg)  06/08/14 172 lb 1.9 oz (78.073 kg)  05/23/14 172 lb 12 oz (78.359 kg)     Lab Results  Component Value Date   WBC 5.2 09/30/2013   HGB 13.0 09/30/2013   HCT 38.6 09/30/2013   PLT 208.0 09/30/2013   GLUCOSE 94 04/01/2014   CHOL 219* 04/01/2014   TRIG 81.0 04/01/2014   HDL 56.00 04/01/2014   LDLDIRECT 158.1 03/18/2013   LDLCALC 147* 04/01/2014   ALT 19 04/01/2014   AST 17 04/01/2014   NA 141 04/01/2014   K 4.9 04/01/2014   CL 106 04/01/2014   CREATININE  0.8 04/01/2014   BUN 20 04/01/2014   CO2 28 04/01/2014   TSH 1.58 09/30/2013       Assessment & Plan:   Problem List Items Addressed This Visit    Frequent UTI    No symptoms now.        GERD (gastroesophageal reflux disease) - Primary    Reflux symptoms as outlined.  Restart prilosec 20mg  q day.  Get her back in soon to reassess.  If persistent problems will need a f/u EGD.  Last 2010.        Health care maintenance    Physical 04/01/14.  Colonoscopy 12/2011.  Mammogram 05/02/14 - Birads I.  PAP 03/18/13 - negative with negative HPV.        Hypercholesterolemia    Low cholesterol diet and exercise.  Follow lipid panel.  She declines to have checked today.        Light  headedness    Better since staying hydrated.  Not a significant issue now.  Follow..       Vitamin D deficiency    Continue vitamin D supplements.            Einar Pheasant, MD

## 2014-08-28 ENCOUNTER — Encounter: Payer: Self-pay | Admitting: Internal Medicine

## 2014-08-28 DIAGNOSIS — Z0000 Encounter for general adult medical examination without abnormal findings: Secondary | ICD-10-CM | POA: Insufficient documentation

## 2014-08-28 NOTE — Assessment & Plan Note (Signed)
Low cholesterol diet and exercise.  Follow lipid panel.  She declines to have checked today.

## 2014-08-28 NOTE — Assessment & Plan Note (Signed)
Better since staying hydrated.  Not a significant issue now.  Follow.Marland Kitchen

## 2014-08-28 NOTE — Assessment & Plan Note (Signed)
Continue vitamin D supplements.  

## 2014-08-28 NOTE — Assessment & Plan Note (Signed)
No symptoms now.

## 2014-08-28 NOTE — Assessment & Plan Note (Signed)
Physical 04/01/14.  Colonoscopy 12/2011.  Mammogram 05/02/14 - Birads I.  PAP 03/18/13 - negative with negative HPV.

## 2014-10-20 ENCOUNTER — Encounter: Payer: Self-pay | Admitting: Internal Medicine

## 2014-10-20 DIAGNOSIS — L989 Disorder of the skin and subcutaneous tissue, unspecified: Secondary | ICD-10-CM

## 2014-10-20 NOTE — Telephone Encounter (Signed)
See pts my chart message.  Order placed for dermatology referral.

## 2014-11-10 ENCOUNTER — Encounter: Payer: Self-pay | Admitting: Internal Medicine

## 2014-11-10 NOTE — Telephone Encounter (Signed)
Spoke with pt, advised of MDs message.  Pt verbalized understanding 

## 2014-11-11 ENCOUNTER — Telehealth: Payer: Self-pay | Admitting: Internal Medicine

## 2014-11-11 NOTE — Telephone Encounter (Signed)
You spoke to this pt yesterday and informed her to be seen.  Where is the documentation?  Pt at [facility].  Needs referral apparently and they are asking for the documentation - where advised to go.  Thanks

## 2014-11-11 NOTE — Telephone Encounter (Signed)
[facility] clinic called and requested referral be placed because patient was a walk-in to office today and advised that [place] had advised patient to come into walk-in clinic no note in chart reflects advise at this time please advise message left on voicemail.

## 2014-11-11 NOTE — Telephone Encounter (Signed)
Spoke with Lexine Baton at [facility].  Advised it was suggested to pt to be seen at [facility] or UC for her back pain.  Lexine Baton states that's all she needed.  No records or notes were sent to [facility].

## 2014-11-11 NOTE — Telephone Encounter (Signed)
Ok.  See if this is enough documentation.  Let me know if I need to do anything more.

## 2014-11-11 NOTE — Telephone Encounter (Signed)
I spoke with the pt, advised her of MDs message that is copied below from yesterdays chart.   Please call pt -given that pts pain is severe, then I recommend an evaluation prior to a referral. I routinely do not do referrals to chiropractors. Again, given the severity of the pain, I recommend evaluation - today - ([facility] or [facility]) and then we can f/u after that. Thanks.       Dr Nicki Reaper   ----- Message -----    From: Cecelia Byars, RN    Sent: 11/10/2014  8:57 AM     To: Einar Pheasant, MD   Subject: FW: Non-Urgent Medical Question                   ----- Message -----    From: [place] L Wartman    Sent: 11/10/2014  8:43 AM     To: [facility] Pool   Subject: Non-Urgent Medical Question               On June 21, I experienced sharp pain from my lower back, through the left buttock, down through the left thigh to the left calf. The pain would come and go and is worse when sitting, so I'm sure it's sciatica. I remember hearing a pop in my back a few days before the pain began. Today is Day 10 and for the past 3 nights I've had to use percocet for the pain at bedtime so I can sleep. The pain has now become excruciating and unbearable for hours each day, and yesterday I began to feel tingling on the bottom of my left foot, which is making me nervous. I have a follow-up appt with you july 25 for the reflux, which has greatly improved, so I was trying to wait for that appt, but I'm wondering if I should maybe see a chiropractor for the sciatica in the meantime?? I'm sure my insurance pays for some chiropractic visits, but not sure if a referral is needed.    Thank you for your advice!   [place]

## 2014-12-05 ENCOUNTER — Ambulatory Visit (INDEPENDENT_AMBULATORY_CARE_PROVIDER_SITE_OTHER): Payer: 59 | Admitting: Internal Medicine

## 2014-12-05 ENCOUNTER — Encounter: Payer: Self-pay | Admitting: Internal Medicine

## 2014-12-05 VITALS — BP 110/80 | HR 86 | Temp 98.3°F | Resp 12 | Ht 61.0 in | Wt 167.4 lb

## 2014-12-05 DIAGNOSIS — M79605 Pain in left leg: Secondary | ICD-10-CM

## 2014-12-05 DIAGNOSIS — R208 Other disturbances of skin sensation: Secondary | ICD-10-CM | POA: Diagnosis not present

## 2014-12-05 DIAGNOSIS — Z0000 Encounter for general adult medical examination without abnormal findings: Secondary | ICD-10-CM | POA: Diagnosis not present

## 2014-12-05 DIAGNOSIS — K219 Gastro-esophageal reflux disease without esophagitis: Secondary | ICD-10-CM | POA: Diagnosis not present

## 2014-12-05 DIAGNOSIS — F439 Reaction to severe stress, unspecified: Secondary | ICD-10-CM

## 2014-12-05 DIAGNOSIS — Z658 Other specified problems related to psychosocial circumstances: Secondary | ICD-10-CM

## 2014-12-05 DIAGNOSIS — E7800 Pure hypercholesterolemia, unspecified: Secondary | ICD-10-CM

## 2014-12-05 DIAGNOSIS — R200 Anesthesia of skin: Secondary | ICD-10-CM | POA: Insufficient documentation

## 2014-12-05 NOTE — Progress Notes (Signed)
Pre visit review using our clinic review tool, if applicable. No additional management support is needed unless otherwise documented below in the visit note. 

## 2014-12-05 NOTE — Progress Notes (Signed)
Patient ID: Meghan Valdez, female   DOB: December 25, 1960, 54 y.o.   MRN: [id number]   Subjective:    Patient ID: Meghan Valdez, female    DOB: 17-Dec-1960, 54 y.o.   MRN: [id number]  HPI  Patient here for a scheduled follow up.  States prilosec worked.  No acid reflux.  Took prilosec for 6 weeks.  Stopped and has not had problems since.  She has been having problems with her left leg.  Increased pain left side of her leg down to her foot.  Numbness in her toes.  Was given prednisone taper and tramadol.  The prednisone helped for a couple of days and then pain returned.  Is better today.  Increased stress with family issues.  Husband planning to go to paramedic school.  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  She does not feel she needs any further intervention at this time.     Past Medical History  Diagnosis Date  . Chronic sinusitis   . Allergic rhinitis   . History of migraine headaches   . Basal cell carcinoma of skin     removed 1998  . Hypercholesterolemia   . Chicken pox   . Migraines     Outpatient Encounter Prescriptions as of 12/05/2014  Medication Sig  . [EXPIRED] traMADol (ULTRAM) 50 MG tablet Take by mouth.  . fluticasone (FLONASE) 50 MCG/ACT nasal spray Place 2 sprays into both nostrils daily.   No facility-administered encounter medications on file as of 12/05/2014.    Review of Systems  Constitutional: Negative for appetite change and unexpected weight change.  HENT: Negative for congestion and sinus pressure.   Respiratory: Negative for cough, chest tightness and shortness of breath.   Cardiovascular: Negative for chest pain, palpitations and leg swelling.  Gastrointestinal: Negative for nausea, vomiting, abdominal pain and diarrhea.  Musculoskeletal:       Left leg pain as outlined.  Numbness as outlined.   Skin: Negative for color change and rash.  Neurological: Negative for dizziness, light-headedness and headaches.  Psychiatric/Behavioral:       Increased  stress as outlined.         Objective:    Physical Exam  Constitutional: She appears well-developed and well-nourished. No distress.  HENT:  Nose: Nose normal.  Mouth/Throat: Oropharynx is clear and moist.  Neck: Neck supple. No thyromegaly present.  Cardiovascular: Normal rate and regular rhythm.   Pulmonary/Chest: Breath sounds normal. No respiratory distress. She has no wheezes.  Abdominal: Soft. Bowel sounds are normal. There is no tenderness.  Musculoskeletal: She exhibits no edema or tenderness.  Lymphadenopathy:    She has no cervical adenopathy.  Skin: No rash noted. No erythema.  Psychiatric: She has a normal mood and affect. Her behavior is normal.    BP 110/80 mmHg  Pulse 86  Temp(Src) 98.3 F (36.8 C) (Oral)  Resp 12  Ht 5\' 1"  (1.549 m)  Wt 167 lb 6.4 oz (75.932 kg)  BMI 31.65 kg/m2  SpO2 97% Wt Readings from Last 3 Encounters:  12/05/14 167 lb 6.4 oz (75.932 kg)  08/23/14 173 lb (78.472 kg)  06/08/14 172 lb 1.9 oz (78.073 kg)     Lab Results  Component Value Date   WBC 5.2 09/30/2013   HGB 13.0 09/30/2013   HCT 38.6 09/30/2013   PLT 208.0 09/30/2013   GLUCOSE 94 04/01/2014   CHOL 219* 04/01/2014   TRIG 81.0 04/01/2014   HDL 56.00 04/01/2014   LDLDIRECT 158.1 03/18/2013   LDLCALC 147*  04/01/2014   ALT 19 04/01/2014   AST 17 04/01/2014   NA 141 04/01/2014   K 4.9 04/01/2014   CL 106 04/01/2014   CREATININE 0.8 04/01/2014   BUN 20 04/01/2014   CO2 28 04/01/2014   TSH 1.58 09/30/2013       Assessment & Plan:   Problem List Items Addressed This Visit    GERD (gastroesophageal reflux disease)    No reflux after prilosec treatment.  Off now and doing well.  Follow.       Health care maintenance    Physical 04/01/14.  Colonoscopy 12/2011.  Mammogram 05/02/14 - Birads I.  PAP 03/18/13 - negative with negative HPV.       Hypercholesterolemia    Low cholesterol diet and exercise.  Follow lipid panel.       Leg pain, left - Primary    Let  pain and numbness in her toes as outlined.  Treated with prednisone and tramadol. Still with increased pain.  Intermittently flares.  Refer to ortho for further evaluation.       Relevant Orders   Ambulatory referral to Orthopedic Surgery   Numbness of foot   Relevant Orders   Ambulatory referral to Orthopedic Surgery   Stress    Increased stress as outlined.  Discussed with her today.  She does not feel needs anything more at this point.  Will follow closely.          I spent 25 minutes with the patient and more than 50% of the time was spent in consultation regarding the above.     Einar Pheasant, MD

## 2014-12-07 ENCOUNTER — Encounter: Payer: Self-pay | Admitting: Internal Medicine

## 2014-12-07 DIAGNOSIS — F439 Reaction to severe stress, unspecified: Secondary | ICD-10-CM | POA: Insufficient documentation

## 2014-12-07 NOTE — Assessment & Plan Note (Signed)
Let pain and numbness in her toes as outlined.  Treated with prednisone and tramadol. Still with increased pain.  Intermittently flares.  Refer to ortho for further evaluation.

## 2014-12-07 NOTE — Assessment & Plan Note (Signed)
Low cholesterol diet and exercise.  Follow lipid panel.   

## 2014-12-07 NOTE — Assessment & Plan Note (Signed)
Increased stress as outlined.  Discussed with her today.  She does not feel needs anything more at this point.  Will follow closely.

## 2014-12-07 NOTE — Assessment & Plan Note (Signed)
Physical 04/01/14.  Colonoscopy 12/2011.  Mammogram 05/02/14 - Birads I.  PAP 03/18/13 - negative with negative HPV.

## 2014-12-07 NOTE — Assessment & Plan Note (Signed)
No reflux after prilosec treatment.  Off now and doing well.  Follow.

## 2014-12-29 ENCOUNTER — Telehealth: Payer: Self-pay | Admitting: Surgical

## 2014-12-29 NOTE — Telephone Encounter (Signed)
Just received message.  I am not here tomorrow or Monday.  We are not able to call in abx without seeing.  I would not mind working her in, but I will not be back until Tuesday.  Ok to see another provider.

## 2014-12-29 NOTE — Telephone Encounter (Signed)
Scheduled appointment with Dr. Sallee Lange for 12/30/14

## 2014-12-29 NOTE — Telephone Encounter (Signed)
Patient is calling in because she thinks that she might have another UTI, her symptoms are odor and urgency. She would like to bring in urine sample.Please advise

## 2014-12-30 ENCOUNTER — Encounter: Payer: Self-pay | Admitting: Family Medicine

## 2014-12-30 ENCOUNTER — Ambulatory Visit (INDEPENDENT_AMBULATORY_CARE_PROVIDER_SITE_OTHER): Payer: 59 | Admitting: Family Medicine

## 2014-12-30 VITALS — BP 110/76 | HR 76 | Temp 98.1°F | Ht 61.0 in | Wt 165.6 lb

## 2014-12-30 DIAGNOSIS — N3000 Acute cystitis without hematuria: Secondary | ICD-10-CM | POA: Diagnosis not present

## 2014-12-30 DIAGNOSIS — R3915 Urgency of urination: Secondary | ICD-10-CM

## 2014-12-30 LAB — URINALYSIS, MICROSCOPIC ONLY

## 2014-12-30 LAB — POCT URINALYSIS DIPSTICK
BILIRUBIN UA: NEGATIVE
Glucose, UA: NEGATIVE
KETONES UA: NEGATIVE
Nitrite, UA: NEGATIVE
Protein, UA: NEGATIVE
Spec Grav, UA: 1.015
Urobilinogen, UA: 0.2
pH, UA: 5.5

## 2014-12-30 MED ORDER — CEPHALEXIN 500 MG PO CAPS: 500.0000 mg | ORAL_CAPSULE | Freq: Two times a day (BID) | ORAL | Status: DC

## 2014-12-30 NOTE — Progress Notes (Signed)
Patient ID: Meghan Valdez, female   DOB: 1961/01/04, 54 y.o.   MRN: [id number]  Tommi Rumps, MD Phone: [phone]  Meghan Valdez is a 54 y.o. female who presents today for same day visit.  Urinary urgency: patient presents with urgency for the past week. Notes dysuria with this and increased frequency. Notes odor like an infection. States this feels like her typical urine infection. No abdominal pain, fevers, back pain, or vaginal discharge. Notes she gets UTIs 2x/year. Is not sexually active in many years. History of total hysterectomy. Has been taking azo tabs that have helped some.   PMH: nonsmoker. UTI in past. Last culture pan sensitive E coli   ROS see HPI  Objective  Physical Exam Filed Vitals:   12/30/14 0844  BP: 110/76  Pulse: 76  Temp: 98.1 F (36.7 C)   Physical Exam  Constitutional: She is well-developed, well-nourished, and in no distress.  HENT:  Head: Normocephalic and atraumatic.  Cardiovascular: Normal rate, regular rhythm and normal heart sounds.   Pulmonary/Chest: Effort normal and breath sounds normal.  Abdominal: Soft. Bowel sounds are normal. She exhibits no distension and no mass. There is no tenderness. There is no rebound and no guarding.  Neurological: She is alert.  Skin: Skin is warm and dry. She is not diaphoretic.     Assessment/Plan: Please see individual problem list.  UTI (urinary tract infection) Symptoms and urine consistent with UTI. No abdominal pain. No vaginal discharge. Normal abdominal exam. Will treat with keflex. Send for culture and micro. Given return precautions.     Orders Placed This Encounter  Procedures  . Urine Culture  . Urine Microscopic Only  . POCT Urinalysis Dipstick    Meds ordered this encounter  Medications  . cephALEXin (KEFLEX) 500 MG capsule    Sig: Take 1 capsule (500 mg total) by mouth 2 (two) times daily.    Dispense:  14 capsule    Refill:  0    Tommi Rumps

## 2014-12-30 NOTE — Progress Notes (Signed)
Pre visit review using our clinic review tool, if applicable. No additional management support is needed unless otherwise documented below in the visit note. 

## 2014-12-30 NOTE — Addendum Note (Signed)
Addended by: Johnsie Cancel on: 12/30/2014 11:15 AM   Modules accepted: Miquel Dunn

## 2014-12-30 NOTE — Assessment & Plan Note (Signed)
Symptoms and urine consistent with UTI. No abdominal pain. No vaginal discharge. Normal abdominal exam. Will treat with keflex. Send for culture and micro. Given return precautions.

## 2014-12-30 NOTE — Patient Instructions (Signed)
Nice to meet you. It looks like you have a urine infection. We will treat this with keflex and send it for culture. If you develop abdominal pain, nausea, vomiting, diarrhea, fever, back pain, or worsening symptoms please seek medical attention.

## 2015-01-05 LAB — URINE CULTURE

## 2015-03-02 ENCOUNTER — Encounter: Payer: Self-pay | Admitting: Internal Medicine

## 2015-03-02 DIAGNOSIS — N390 Urinary tract infection, site not specified: Secondary | ICD-10-CM

## 2015-03-02 NOTE — Telephone Encounter (Signed)
Order placed for urology referral.  

## 2015-04-10 ENCOUNTER — Ambulatory Visit (INDEPENDENT_AMBULATORY_CARE_PROVIDER_SITE_OTHER): Payer: 59 | Admitting: Internal Medicine

## 2015-04-10 ENCOUNTER — Encounter: Payer: Self-pay | Admitting: Internal Medicine

## 2015-04-10 VITALS — BP 120/80 | HR 71 | Temp 98.1°F | Resp 18 | Ht 61.0 in | Wt 153.0 lb

## 2015-04-10 DIAGNOSIS — E7800 Pure hypercholesterolemia, unspecified: Secondary | ICD-10-CM | POA: Diagnosis not present

## 2015-04-10 DIAGNOSIS — E559 Vitamin D deficiency, unspecified: Secondary | ICD-10-CM

## 2015-04-10 DIAGNOSIS — Z1239 Encounter for other screening for malignant neoplasm of breast: Secondary | ICD-10-CM

## 2015-04-10 DIAGNOSIS — N390 Urinary tract infection, site not specified: Secondary | ICD-10-CM

## 2015-04-10 DIAGNOSIS — Z0000 Encounter for general adult medical examination without abnormal findings: Secondary | ICD-10-CM | POA: Diagnosis not present

## 2015-04-10 DIAGNOSIS — Z658 Other specified problems related to psychosocial circumstances: Secondary | ICD-10-CM

## 2015-04-10 DIAGNOSIS — F439 Reaction to severe stress, unspecified: Secondary | ICD-10-CM

## 2015-04-10 NOTE — Progress Notes (Signed)
Pre-visit discussion using our clinic review tool. No additional management support is needed unless otherwise documented below in the visit note.  

## 2015-04-10 NOTE — Progress Notes (Signed)
Patient ID: Meghan Valdez, female   DOB: 12/08/1960, 54 y.o.   MRN: [id number]   Subjective:    Patient ID: Meghan Valdez, female    DOB: February 10, 1961, 54 y.o.   MRN: [id number]  HPI  Patient with past history of hypercholesterolemia, allergies and reoccurring sinus issues.  She comes in today to follow up on these issues as well as for a complete physical exam.  Increased stress recently.  Her husband left her last week.  Some increased anxiety related to this.  Discussed with her today.  She has good support from her family.  Does not feel she needs anything more at this time.  Tries to stay active.  No cardiac symptoms with increased activity or exertion.  No sob.  No acid reflux.  No abdominal pain or cramping.  Bowels stable.  Discussed diet and exercise.     Past Medical History  Diagnosis Date  . Chronic sinusitis   . Allergic rhinitis   . History of migraine headaches   . Basal cell carcinoma of skin     removed 1998  . Hypercholesterolemia   . Chicken pox   . Migraines    Past Surgical History  Procedure Laterality Date  . Breast lumpectomy  1986    benign  . Liposuction    . Cesarean section  1992  . Septoplasty  1993  . Skin cancer excision  1998    basal cell carcinoma of the scalp  . Total abdominal hysterectomy w/ bilateral salpingoophorectomy      lysis of adhesions  . Laparoscopic cholecystectomy  2008   Family History  Problem Relation Age of Onset  . Rheumatic fever Father   . Hypertension Mother   . Hyperlipidemia Mother   . Diabetes Mother   . Hypertension Maternal Grandmother   . Breast cancer Neg Hx   . Colon cancer Neg Hx   . Diabetes Brother    Social History   Social History  . Marital Status: Married    Spouse Name: N/A  . Number of Children: 2  . Years of Education: N/A   Occupational History  . retired - med Designer, multimedia    Social History Main Topics  . Smoking status: Never Smoker   . Smokeless tobacco: Never Used  . Alcohol Use:  0.0 oz/week    0 Standard drinks or equivalent per week  . Drug Use: No  . Sexual Activity: Not Asked   Other Topics Concern  . None   Social History Narrative    Outpatient Encounter Prescriptions as of 04/10/2015  Medication Sig  . fluticasone (FLONASE) 50 MCG/ACT nasal spray Place 2 sprays into both nostrils daily.  . [DISCONTINUED] cephALEXin (KEFLEX) 500 MG capsule Take 1 capsule (500 mg total) by mouth 2 (two) times daily.   No facility-administered encounter medications on file as of 04/10/2015.    Review of Systems  Constitutional:       She is eating.  Has lost weiht.    HENT: Negative for congestion and sinus pressure.   Eyes: Negative for pain and visual disturbance.  Respiratory: Negative for cough, chest tightness and shortness of breath.   Cardiovascular: Negative for chest pain, palpitations and leg swelling.  Gastrointestinal: Negative for nausea, vomiting, abdominal pain and diarrhea.  Genitourinary: Negative for dysuria and difficulty urinating.  Musculoskeletal: Negative for back pain and joint swelling.  Skin: Negative for color change and rash.  Neurological: Negative for dizziness, light-headedness and headaches.  Hematological: Negative for  adenopathy. Does not bruise/bleed easily.  Psychiatric/Behavioral: Negative for dysphoric mood and agitation.       Objective:    Physical Exam  Constitutional: She is oriented to person, place, and time. She appears well-developed and well-nourished. No distress.  HENT:  Nose: Nose normal.  Mouth/Throat: Oropharynx is clear and moist.  Eyes: Right eye exhibits no discharge. Left eye exhibits no discharge. No scleral icterus.  Neck: Neck supple. No thyromegaly present.  Cardiovascular: Normal rate and regular rhythm.   Pulmonary/Chest: Breath sounds normal. No accessory muscle usage. No tachypnea. No respiratory distress. She has no decreased breath sounds. She has no wheezes. She has no rhonchi. Right breast  exhibits no inverted nipple, no mass, no nipple discharge and no tenderness (no axillary adenopathy). Left breast exhibits no inverted nipple, no mass, no nipple discharge and no tenderness (no axilarry adenopathy).  Abdominal: Soft. Bowel sounds are normal. There is no tenderness.  Musculoskeletal: She exhibits no edema or tenderness.  Lymphadenopathy:    She has no cervical adenopathy.  Neurological: She is alert and oriented to person, place, and time.  Skin: Skin is warm. No rash noted. No erythema.  Psychiatric: She has a normal mood and affect. Her behavior is normal.    BP 120/80 mmHg  Pulse 71  Temp(Src) 98.1 F (36.7 C) (Oral)  Resp 18  Ht $R'5\' 1"'ks$  (1.549 m)  Wt 153 lb (69.4 kg)  BMI 28.92 kg/m2  SpO2 98% Wt Readings from Last 3 Encounters:  04/10/15 153 lb (69.4 kg)  12/30/14 165 lb 9.6 oz (75.116 kg)  12/05/14 167 lb 6.4 oz (75.932 kg)     Lab Results  Component Value Date   WBC 5.8 04/10/2015   HGB 13.0 09/30/2013   HCT 39.3 04/10/2015   PLT 208.0 09/30/2013   GLUCOSE 89 04/10/2015   CHOL 211* 04/10/2015   TRIG 107 04/10/2015   HDL 59 04/10/2015   LDLDIRECT 158.1 03/18/2013   LDLCALC 131* 04/10/2015   ALT 13 04/10/2015   AST 14 04/10/2015   NA 143 04/10/2015   K 4.2 04/10/2015   CL 101 04/10/2015   CREATININE 0.70 04/10/2015   BUN 11 04/10/2015   CO2 25 04/10/2015   TSH 1.180 04/10/2015       Assessment & Plan:   Problem List Items Addressed This Visit    Frequent UTI    Scheduled for urology evaluation.   Is scheduled to see Dr Jacqlyn Larsen.        Health care maintenance    Physical today 04/10/15.  Colonoscopy 12/2011.  Mammogram 05/02/14 - birads I.  Schedule f/u mammogram.  PAP 03/18/13 - negative with negative HPV.        Hypercholesterolemia    Low cholesterol diet and exercise.  Follow lipid panel.        Relevant Orders   CBC with Differential/Platelet (Completed)   Comp Met (CMET) (Completed)   Lipid panel (Completed)   TSH (Completed)    VITAMIN D 25 Hydroxy (Vit-D Deficiency, Fractures) (Completed)   Stress    Increased stress as outlined.  Discussed with her today.  She declines medication.  Declines counseling.  Has good support from her family.  Follow.  Notify me if any changes.        Relevant Orders   CBC with Differential/Platelet (Completed)   Comp Met (CMET) (Completed)   Lipid panel (Completed)   TSH (Completed)   VITAMIN D 25 Hydroxy (Vit-D Deficiency, Fractures) (Completed)   Vitamin D deficiency  Recheck vitamin D level with labs today.        Relevant Orders   CBC with Differential/Platelet (Completed)   Comp Met (CMET) (Completed)   Lipid panel (Completed)   TSH (Completed)   VITAMIN D 25 Hydroxy (Vit-D Deficiency, Fractures) (Completed)    Other Visit Diagnoses    Routine general medical examination at a health care facility    -  Primary    Screening breast examination        Relevant Orders    MM DIGITAL SCREENING BILATERAL    CBC with Differential/Platelet (Completed)    Comp Met (CMET) (Completed)    Lipid panel (Completed)    TSH (Completed)    VITAMIN D 25 Hydroxy (Vit-D Deficiency, Fractures) (Completed)        Einar Pheasant, MD

## 2015-04-11 LAB — COMPREHENSIVE METABOLIC PANEL
A/G RATIO: 1.9 (ref 1.1–2.5)
ALBUMIN: 4.5 g/dL (ref 3.5–5.5)
ALK PHOS: 92 IU/L (ref 39–117)
ALT: 13 IU/L (ref 0–32)
AST: 14 IU/L (ref 0–40)
BILIRUBIN TOTAL: 0.6 mg/dL (ref 0.0–1.2)
BUN / CREAT RATIO: 16 (ref 9–23)
BUN: 11 mg/dL (ref 6–24)
CHLORIDE: 101 mmol/L (ref 97–106)
CO2: 25 mmol/L (ref 18–29)
Calcium: 9.6 mg/dL (ref 8.7–10.2)
Creatinine, Ser: 0.7 mg/dL (ref 0.57–1.00)
GFR calc non Af Amer: 98 mL/min/{1.73_m2} (ref >59)
GFR, EST AFRICAN AMERICAN: 113 mL/min/{1.73_m2} (ref >59)
GLOBULIN, TOTAL: 2.4 g/dL (ref 1.5–4.5)
GLUCOSE: 89 mg/dL (ref 65–99)
POTASSIUM: 4.2 mmol/L (ref 3.5–5.2)
SODIUM: 143 mmol/L (ref 136–144)
TOTAL PROTEIN: 6.9 g/dL (ref 6.0–8.5)

## 2015-04-11 LAB — CBC WITH DIFFERENTIAL/PLATELET
BASOS ABS: 0 10*3/uL (ref 0.0–0.2)
Basos: 0 %
EOS (ABSOLUTE): 0.1 10*3/uL (ref 0.0–0.4)
Eos: 1 %
Hematocrit: 39.3 % (ref 34.0–46.6)
Hemoglobin: 13 g/dL (ref 11.1–15.9)
IMMATURE GRANS (ABS): 0 10*3/uL (ref 0.0–0.1)
Immature Granulocytes: 0 %
LYMPHS: 29 %
Lymphocytes Absolute: 1.7 10*3/uL (ref 0.7–3.1)
MCH: 29.5 pg (ref 26.6–33.0)
MCHC: 33.1 g/dL (ref 31.5–35.7)
MCV: 89 fL (ref 79–97)
MONOCYTES: 5 %
Monocytes Absolute: 0.3 10*3/uL (ref 0.1–0.9)
NEUTROS ABS: 3.7 10*3/uL (ref 1.4–7.0)
Neutrophils: 65 %
PLATELETS: 218 10*3/uL (ref 150–379)
RBC: 4.4 x10E6/uL (ref 3.77–5.28)
RDW: 13.3 % (ref 12.3–15.4)
WBC: 5.8 10*3/uL (ref 3.4–10.8)

## 2015-04-11 LAB — LIPID PANEL
CHOLESTEROL TOTAL: 211 mg/dL — AB (ref 100–199)
Chol/HDL Ratio: 3.6 ratio units (ref 0.0–4.4)
HDL: 59 mg/dL (ref >39)
LDL Calculated: 131 mg/dL — ABNORMAL HIGH (ref 0–99)
Triglycerides: 107 mg/dL (ref 0–149)
VLDL CHOLESTEROL CAL: 21 mg/dL (ref 5–40)

## 2015-04-11 LAB — TSH: TSH: 1.18 u[IU]/mL (ref 0.450–4.500)

## 2015-04-11 LAB — VITAMIN D 25 HYDROXY (VIT D DEFICIENCY, FRACTURES): VIT D 25 HYDROXY: 17.3 ng/mL — AB (ref 30.0–100.0)

## 2015-04-12 ENCOUNTER — Encounter: Payer: Self-pay | Admitting: Internal Medicine

## 2015-04-16 ENCOUNTER — Encounter: Payer: Self-pay | Admitting: Internal Medicine

## 2015-04-16 NOTE — Assessment & Plan Note (Signed)
Recheck vitamin D level with labs today.  

## 2015-04-16 NOTE — Assessment & Plan Note (Signed)
Scheduled for urology evaluation.   Is scheduled to see Dr Jacqlyn Larsen.

## 2015-04-16 NOTE — Assessment & Plan Note (Signed)
Low cholesterol diet and exercise.  Follow lipid panel.   

## 2015-04-16 NOTE — Assessment & Plan Note (Signed)
Physical today 04/10/15.  Colonoscopy 12/2011.  Mammogram 05/02/14 - birads I.  Schedule f/u mammogram.  PAP 03/18/13 - negative with negative HPV.

## 2015-04-16 NOTE — Assessment & Plan Note (Signed)
Increased stress as outlined.  Discussed with her today.  She declines medication.  Declines counseling.  Has good support from her family.  Follow.  Notify me if any changes.

## 2015-05-04 ENCOUNTER — Ambulatory Visit
Admission: RE | Admit: 2015-05-04 | Discharge: 2015-05-04 | Disposition: A | Discharge status: Home / Self Care | Payer: 59 | Source: Ambulatory Visit | Attending: Internal Medicine | Admitting: Internal Medicine

## 2015-05-04 DIAGNOSIS — Z1239 Encounter for other screening for malignant neoplasm of breast: Secondary | ICD-10-CM

## 2015-05-04 DIAGNOSIS — Z1231 Encounter for screening mammogram for malignant neoplasm of breast: Secondary | ICD-10-CM | POA: Insufficient documentation

## 2015-10-10 ENCOUNTER — Ambulatory Visit (INDEPENDENT_AMBULATORY_CARE_PROVIDER_SITE_OTHER): Payer: 59 | Admitting: Internal Medicine

## 2015-10-10 ENCOUNTER — Encounter: Payer: Self-pay | Admitting: Internal Medicine

## 2015-10-10 VITALS — BP 120/78 | HR 65 | Temp 97.7°F | Resp 18 | Ht 61.0 in | Wt 150.1 lb

## 2015-10-10 DIAGNOSIS — E7800 Pure hypercholesterolemia, unspecified: Secondary | ICD-10-CM | POA: Diagnosis not present

## 2015-10-10 DIAGNOSIS — Z658 Other specified problems related to psychosocial circumstances: Secondary | ICD-10-CM

## 2015-10-10 DIAGNOSIS — F439 Reaction to severe stress, unspecified: Secondary | ICD-10-CM

## 2015-10-10 DIAGNOSIS — M2560 Stiffness of unspecified joint, not elsewhere classified: Secondary | ICD-10-CM | POA: Diagnosis not present

## 2015-10-10 DIAGNOSIS — N390 Urinary tract infection, site not specified: Secondary | ICD-10-CM

## 2015-10-10 NOTE — Assessment & Plan Note (Signed)
Increased pain and stiffness and swelling in her right fourth finger.  Worsened.  Refer to rheumatology for evaluation and question of need for injection.

## 2015-10-10 NOTE — Assessment & Plan Note (Signed)
Cholesterol was better on last check.  Wants to hold on checking today.  Follow lipid panel.  She is watching her diet now.

## 2015-10-10 NOTE — Assessment & Plan Note (Signed)
Saw Dr Jacqlyn Larsen.  See note.  Negative CT and cysto.  Using estrogen cream.  Continue f/u with Dr Jacqlyn Larsen.

## 2015-10-10 NOTE — Progress Notes (Signed)
Patient ID: Meghan Valdez, female   DOB: 01/14/1961, 55 y.o.   MRN: [id number]   Subjective:    Patient ID: Meghan Valdez, female    DOB: 09-17-1960, 55 y.o.   MRN: [id number]  HPI  Patient here for a scheduled follow up.  She has been under increased stress.  Recently separated from her husband.  Going through a lot of legal issues.  She lost her appetite for awhile.  Appetite is better now.  Eating.  Getting hungry.  Has lost weight, but relates to above.  Is better now.  No nausea or vomiting.  No diarrhea.  No abdominal pain or cramping.  Just saw urology.  See note.  Had negative cysto and negative CT.  Does report increased swelling and pain right fourth finger.  Increased pain over the last few months.  Arthritis changes present.     Past Medical History  Diagnosis Date  . Chronic sinusitis   . Allergic rhinitis   . History of migraine headaches   . Hypercholesterolemia   . Chicken pox   . Migraines   . Basal cell carcinoma of skin     removed 1998   Past Surgical History  Procedure Laterality Date  . Breast lumpectomy  1986    benign  . Liposuction    . Cesarean section  1992  . Septoplasty  1993  . Skin cancer excision  1998    basal cell carcinoma of the scalp  . Total abdominal hysterectomy w/ bilateral salpingoophorectomy      lysis of adhesions  . Laparoscopic cholecystectomy  2008  . Breast biopsy Left 1986    benign   Family History  Problem Relation Age of Onset  . Rheumatic fever Father   . Hypertension Mother   . Hyperlipidemia Mother   . Diabetes Mother   . Hypertension Maternal Grandmother   . Colon cancer Neg Hx   . Diabetes Brother   . Breast cancer Maternal Aunt     great  . Breast cancer Paternal Aunt     great   Social History   Social History  . Marital Status: Married    Spouse Name: N/A  . Number of Children: 2  . Years of Education: N/A   Occupational History  . retired - med Designer, multimedia    Social History Main Topics  .  Smoking status: Never Smoker   . Smokeless tobacco: Never Used  . Alcohol Use: 0.0 oz/week    0 Standard drinks or equivalent per week  . Drug Use: No  . Sexual Activity: Not Asked   Other Topics Concern  . None   Social History Narrative    Outpatient Encounter Prescriptions as of 10/10/2015  Medication Sig  . estradiol (ESTRACE VAGINAL) 0.1 MG/GM vaginal cream   . fluticasone (FLONASE) 50 MCG/ACT nasal spray Place 2 sprays into both nostrils daily.   No facility-administered encounter medications on file as of 10/10/2015.    Review of Systems  Constitutional:       Previous decreased appetite.  Decreased weight.  Appetite better now.   HENT: Negative for congestion and sinus pressure.   Respiratory: Negative for cough, chest tightness and shortness of breath.   Cardiovascular: Negative for chest pain, palpitations and leg swelling.  Gastrointestinal: Negative for abdominal pain and diarrhea.  Genitourinary: Negative for dysuria and difficulty urinating.  Musculoskeletal: Negative for back pain.       Right fourth finger pain and swelling as outlined.  Skin: Negative for color change and rash.  Neurological: Negative for dizziness, light-headedness and headaches.  Psychiatric/Behavioral: Negative for dysphoric mood and agitation.       Increased stress as outlined.        Objective:     Blood pressure rechecked by me:  128/78  Physical Exam  Constitutional: She appears well-developed and well-nourished. No distress.  HENT:  Nose: Nose normal.  Mouth/Throat: Oropharynx is clear and moist.  Neck: Neck supple. No thyromegaly present.  Cardiovascular: Normal rate and regular rhythm.   Pulmonary/Chest: Breath sounds normal. No respiratory distress. She has no wheezes.  Abdominal: Soft. Bowel sounds are normal. There is no tenderness.  Musculoskeletal: She exhibits no edema or tenderness.  Increased pain and swelling right fourth finger.  Changes - fingers c/w OA.     Lymphadenopathy:    She has no cervical adenopathy.  Skin: No rash noted. No erythema.  Psychiatric: She has a normal mood and affect. Her behavior is normal.    BP 120/78 mmHg  Pulse 65  Temp(Src) 97.7 F (36.5 C) (Oral)  Resp 18  Ht 5\' 1"  (1.549 m)  Wt 150 lb 2 oz (68.096 kg)  BMI 28.38 kg/m2  SpO2 98% Wt Readings from Last 3 Encounters:  10/10/15 150 lb 2 oz (68.096 kg)  04/10/15 153 lb (69.4 kg)  12/30/14 165 lb 9.6 oz (75.116 kg)     Lab Results  Component Value Date   WBC 5.8 04/10/2015   HGB 13.0 09/30/2013   HCT 39.3 04/10/2015   PLT 218 04/10/2015   GLUCOSE 89 04/10/2015   CHOL 211* 04/10/2015   TRIG 107 04/10/2015   HDL 59 04/10/2015   LDLDIRECT 158.1 03/18/2013   LDLCALC 131* 04/10/2015   ALT 13 04/10/2015   AST 14 04/10/2015   NA 143 04/10/2015   K 4.2 04/10/2015   CL 101 04/10/2015   CREATININE 0.70 04/10/2015   BUN 11 04/10/2015   CO2 25 04/10/2015   TSH 1.180 04/10/2015    Mm Digital Screening Bilateral  05/04/2015  CLINICAL DATA:  Screening. EXAM: DIGITAL SCREENING BILATERAL MAMMOGRAM WITH CAD COMPARISON:  Previous exam(s). ACR Breast Density Category b: There are scattered areas of fibroglandular density. FINDINGS: There are no findings suspicious for malignancy. Images were processed with CAD. IMPRESSION: No mammographic evidence of malignancy. A result letter of this screening mammogram will be mailed directly to the patient. RECOMMENDATION: Screening mammogram in one year. (Code:SM-B-01Y) BI-RADS CATEGORY  1: Negative. Electronically Signed   By: Franki Cabot M.D.   On: 05/04/2015 09:31       Assessment & Plan:   Problem List Items Addressed This Visit    Frequent UTI    Saw Dr Jacqlyn Larsen.  See note.  Negative CT and cysto.  Using estrogen cream.  Continue f/u with Dr Jacqlyn Larsen.       Hypercholesterolemia - Primary    Cholesterol was better on last check.  Wants to hold on checking today.  Follow lipid panel.  She is watching her diet now.        Joint stiffness    Increased pain and stiffness and swelling in her right fourth finger.  Worsened.  Refer to rheumatology for evaluation and question of need for injection.        Relevant Orders   Ambulatory referral to Rheumatology   Stress    Increased stress as outlined.  Feels she is doing better.  Has good support system.  Follow.  Einar Pheasant, MD

## 2015-10-10 NOTE — Progress Notes (Signed)
Pre-visit discussion using our clinic review tool. No additional management support is needed unless otherwise documented below in the visit note.  

## 2015-10-10 NOTE — Assessment & Plan Note (Signed)
Increased stress as outlined.  Feels she is doing better.  Has good support system.  Follow.

## 2016-01-10 ENCOUNTER — Ambulatory Visit: Payer: 59 | Admitting: Internal Medicine

## 2016-02-01 ENCOUNTER — Encounter: Payer: Self-pay | Admitting: Internal Medicine

## 2016-02-01 NOTE — Telephone Encounter (Signed)
I need to see her.  I can see her tomorrow at 11:00.  Please notify her and add to schedule.

## 2016-02-02 ENCOUNTER — Ambulatory Visit (INDEPENDENT_AMBULATORY_CARE_PROVIDER_SITE_OTHER): Payer: 59 | Admitting: Internal Medicine

## 2016-02-02 ENCOUNTER — Encounter: Payer: Self-pay | Admitting: Internal Medicine

## 2016-02-02 DIAGNOSIS — Z658 Other specified problems related to psychosocial circumstances: Secondary | ICD-10-CM

## 2016-02-02 DIAGNOSIS — F439 Reaction to severe stress, unspecified: Secondary | ICD-10-CM

## 2016-02-02 MED ORDER — ALPRAZOLAM 0.25 MG PO TABS: 0.2500 mg | ORAL_TABLET | Freq: Two times a day (BID) | ORAL | 0 refills | Status: DC | PRN

## 2016-02-02 NOTE — Progress Notes (Signed)
Patient ID: Meghan Valdez, female   DOB: [birth year], 55 y.o.   MRN: [id number]   Subjective:    Patient ID: Meghan Valdez, female    DOB: 1960/12/01, 55 y.o.   MRN: [id number]  HPI  Patient here as a work in to discuss increased stress.  She is going through a separation.  Due in court soon.  Overall she feels she is handling things relatively well.  Feels just needs something to take when goes to court.  She is eating.  Sleeping ok.  Working.      Past Medical History:  Diagnosis Date  . Allergic rhinitis   . Basal cell carcinoma of skin    removed 1998  . Chicken pox   . Chronic sinusitis   . History of migraine headaches   . Hypercholesterolemia   . Migraines    Past Surgical History:  Procedure Laterality Date  . BREAST BIOPSY Left 1986   benign  . BREAST LUMPECTOMY  1986   benign  . CESAREAN SECTION  1992  . LAPAROSCOPIC CHOLECYSTECTOMY  2008  . LIPOSUCTION    . SEPTOPLASTY  1993  . SKIN CANCER EXCISION  1998   basal cell carcinoma of the scalp  . TOTAL ABDOMINAL HYSTERECTOMY W/ BILATERAL SALPINGOOPHORECTOMY     lysis of adhesions   Family History  Problem Relation Age of Onset  . Rheumatic fever Father   . Hypertension Mother   . Hyperlipidemia Mother   . Diabetes Mother   . Hypertension Maternal Grandmother   . Diabetes Brother   . Breast cancer Maternal Aunt     great  . Breast cancer Paternal Aunt     great  . Colon cancer Neg Hx    Social History   Social History  . Marital status: Married    Spouse name: N/A  . Number of children: 2  . Years of education: N/A   Occupational History  . retired - med Designer, multimedia    Social History Main Topics  . Smoking status: Never Smoker  . Smokeless tobacco: Never Used  . Alcohol use 0.0 oz/week  . Drug use: No  . Sexual activity: Not Asked   Other Topics Concern  . None   Social History Narrative  . None    Outpatient Encounter Prescriptions as of 02/02/2016  Medication Sig  . estradiol  (ESTRACE VAGINAL) 0.1 MG/GM vaginal cream   . fluticasone (FLONASE) 50 MCG/ACT nasal spray Place 2 sprays into both nostrils daily.  Marland Kitchen ALPRAZolam (XANAX) 0.25 MG tablet Take 1 tablet (0.25 mg total) by mouth 2 (two) times daily as needed for anxiety.   No facility-administered encounter medications on file as of 02/02/2016.     Review of Systems  Constitutional: Negative for appetite change and unexpected weight change.  Respiratory: Negative for chest tightness and shortness of breath.   Gastrointestinal: Negative for nausea and vomiting.       Intermittent loose stool.    Neurological: Negative for light-headedness and headaches.  Psychiatric/Behavioral:       Increased stress as outlined.  Sleeping ok.         Objective:    Physical Exam  Neck: Neck supple.  Cardiovascular: Normal rate and regular rhythm.   Pulmonary/Chest: Breath sounds normal. No respiratory distress. She has no wheezes.  Abdominal: Soft. Bowel sounds are normal. There is no tenderness.  Musculoskeletal: She exhibits no edema or tenderness.  Lymphadenopathy:    She has no cervical adenopathy.  Psychiatric: She  has a normal mood and affect. Her behavior is normal.    BP 116/62   Pulse 80   Temp 97.7 F (36.5 C) (Oral)   Ht 5' 1.5" (1.562 m)   Wt 150 lb 3.2 oz (68.1 kg)   SpO2 98%   BMI 27.92 kg/m  Wt Readings from Last 3 Encounters:  02/02/16 150 lb 3.2 oz (68.1 kg)  10/10/15 150 lb 2 oz (68.1 kg)  04/10/15 153 lb (69.4 kg)     Lab Results  Component Value Date   WBC 5.8 04/10/2015   HGB 13.0 09/30/2013   HCT 39.3 04/10/2015   PLT 218 04/10/2015   GLUCOSE 89 04/10/2015   CHOL 211 (H) 04/10/2015   TRIG 107 04/10/2015   HDL 59 04/10/2015   LDLDIRECT 158.1 03/18/2013   LDLCALC 131 (H) 04/10/2015   ALT 13 04/10/2015   AST 14 04/10/2015   NA 143 04/10/2015   K 4.2 04/10/2015   CL 101 04/10/2015   CREATININE 0.70 04/10/2015   BUN 11 04/10/2015   CO2 25 04/10/2015   TSH 1.180 04/10/2015      Mm Digital Screening Bilateral  Result Date: 05/04/2015 CLINICAL DATA:  Screening. EXAM: DIGITAL SCREENING BILATERAL MAMMOGRAM WITH CAD COMPARISON:  Previous exam(s). ACR Breast Density Category b: There are scattered areas of fibroglandular density. FINDINGS: There are no findings suspicious for malignancy. Images were processed with CAD. IMPRESSION: No mammographic evidence of malignancy. A result letter of this screening mammogram will be mailed directly to the patient. RECOMMENDATION: Screening mammogram in one year. (Code:SM-B-01Y) BI-RADS CATEGORY  1: Negative. Electronically Signed   By: Franki Cabot M.D.   On: 05/04/2015 09:31       Assessment & Plan:   Problem List Items Addressed This Visit    Stress    Increased stress as outlined.  Discussed with her today.  Just needs something intermittently.  Xanax prn as directed.  Follow.        Other Visit Diagnoses   None.      Einar Pheasant, MD

## 2016-02-02 NOTE — Progress Notes (Signed)
Pre visit review using our clinic review tool, if applicable. No additional management support is needed unless otherwise documented below in the visit note. 

## 2016-02-02 NOTE — Patient Instructions (Signed)
Probiotics:  Culturelle, florastor or align

## 2016-02-03 ENCOUNTER — Encounter: Payer: Self-pay | Admitting: Internal Medicine

## 2016-02-03 NOTE — Assessment & Plan Note (Signed)
Increased stress as outlined.  Discussed with her today.  Just needs something intermittently.  Xanax prn as directed.  Follow.

## 2016-04-16 ENCOUNTER — Ambulatory Visit (INDEPENDENT_AMBULATORY_CARE_PROVIDER_SITE_OTHER): Payer: 59 | Admitting: Internal Medicine

## 2016-04-16 ENCOUNTER — Encounter: Payer: Self-pay | Admitting: Internal Medicine

## 2016-04-16 VITALS — BP 118/78 | HR 76 | Temp 97.8°F | Ht 62.0 in | Wt 155.0 lb

## 2016-04-16 DIAGNOSIS — E7800 Pure hypercholesterolemia, unspecified: Secondary | ICD-10-CM

## 2016-04-16 DIAGNOSIS — Z0000 Encounter for general adult medical examination without abnormal findings: Secondary | ICD-10-CM | POA: Diagnosis not present

## 2016-04-16 DIAGNOSIS — E559 Vitamin D deficiency, unspecified: Secondary | ICD-10-CM | POA: Diagnosis not present

## 2016-04-16 DIAGNOSIS — Z1239 Encounter for other screening for malignant neoplasm of breast: Secondary | ICD-10-CM

## 2016-04-16 DIAGNOSIS — F439 Reaction to severe stress, unspecified: Secondary | ICD-10-CM

## 2016-04-16 NOTE — Progress Notes (Signed)
Patient ID: Meghan Valdez, female   DOB: 1960-12-01, 55 y.o.   MRN: [id number]   Subjective:    Patient ID: Meghan Valdez, female    DOB: 1960/08/08, 55 y.o.   MRN: [id number]  HPI  Patient here for her physical exam.  She feels she is doing well.  Tries to stay active.  No chest pain.  No sob.  No acid reflux.  No abdominal pain or cramping.  Bowels stable.  Increased stress recently with the separation of her husband.  Things are better now.  They settled out of court.  She feels she is handling things relatively well.     Past Medical History:  Diagnosis Date  . Allergic rhinitis   . Basal cell carcinoma of skin    removed 1998  . Chicken pox   . Chronic sinusitis   . History of migraine headaches   . Hypercholesterolemia   . Migraines    Past Surgical History:  Procedure Laterality Date  . BREAST BIOPSY Left 1986   benign  . BREAST LUMPECTOMY  1986   benign  . CESAREAN SECTION  1992  . LAPAROSCOPIC CHOLECYSTECTOMY  2008  . LIPOSUCTION    . SEPTOPLASTY  1993  . SKIN CANCER EXCISION  1998   basal cell carcinoma of the scalp  . TOTAL ABDOMINAL HYSTERECTOMY W/ BILATERAL SALPINGOOPHORECTOMY     lysis of adhesions   Family History  Problem Relation Age of Onset  . Rheumatic fever Father   . Hypertension Mother   . Hyperlipidemia Mother   . Diabetes Mother   . Hypertension Maternal Grandmother   . Diabetes Brother   . Breast cancer Maternal Aunt     great  . Breast cancer Paternal Aunt     great  . Colon cancer Neg Hx    Social History   Social History  . Marital status: Married    Spouse name: N/A  . Number of children: 2  . Years of education: N/A   Occupational History  . retired - med Designer, multimedia    Social History Main Topics  . Smoking status: Never Smoker  . Smokeless tobacco: Never Used  . Alcohol use 0.0 oz/week  . Drug use: No  . Sexual activity: Not Asked   Other Topics Concern  . None   Social History Narrative  . None    Outpatient  Encounter Prescriptions as of 04/16/2016  Medication Sig  . ALPRAZolam (XANAX) 0.25 MG tablet Take 1 tablet (0.25 mg total) by mouth 2 (two) times daily as needed for anxiety.  Marland Kitchen estradiol (ESTRACE VAGINAL) 0.1 MG/GM vaginal cream   . fluticasone (FLONASE) 50 MCG/ACT nasal spray Place 2 sprays into both nostrils daily.   No facility-administered encounter medications on file as of 04/16/2016.     Review of Systems  Constitutional: Negative for appetite change and unexpected weight change.  HENT: Negative for congestion and sinus pressure.   Eyes: Negative for pain and visual disturbance.  Respiratory: Negative for cough, chest tightness and shortness of breath.   Cardiovascular: Negative for chest pain, palpitations and leg swelling.  Gastrointestinal: Negative for abdominal pain, diarrhea, nausea and vomiting.  Genitourinary: Negative for difficulty urinating and dysuria.  Musculoskeletal: Negative for back pain and joint swelling.  Skin: Negative for color change and rash.  Neurological: Negative for dizziness, light-headedness and headaches.  Hematological: Negative for adenopathy. Does not bruise/bleed easily.  Psychiatric/Behavioral: Negative for agitation and dysphoric mood.       Objective:  Physical Exam  Constitutional: She is oriented to person, place, and time. She appears well-developed and well-nourished. No distress.  HENT:  Nose: Nose normal.  Mouth/Throat: Oropharynx is clear and moist.  Eyes: Right eye exhibits no discharge. Left eye exhibits no discharge. No scleral icterus.  Neck: Neck supple. No thyromegaly present.  Cardiovascular: Normal rate and regular rhythm.   Pulmonary/Chest: Breath sounds normal. No accessory muscle usage. No tachypnea. No respiratory distress. She has no decreased breath sounds. She has no wheezes. She has no rhonchi. Right breast exhibits no inverted nipple, no mass, no nipple discharge and no tenderness (no axillary adenopathy). Left  breast exhibits no inverted nipple, no mass, no nipple discharge and no tenderness (no axilarry adenopathy).  Abdominal: Soft. Bowel sounds are normal. There is no tenderness.  Musculoskeletal: She exhibits no edema or tenderness.  Lymphadenopathy:    She has no cervical adenopathy.  Neurological: She is alert and oriented to person, place, and time.  Skin: Skin is warm. No rash noted. No erythema.  Psychiatric: She has a normal mood and affect. Her behavior is normal.    BP 118/78   Pulse 76   Temp 97.8 F (36.6 C) (Oral)   Ht 5\' 2"  (1.575 m)   Wt 155 lb (70.3 kg)   SpO2 98%   BMI 28.35 kg/m  Wt Readings from Last 3 Encounters:  04/16/16 155 lb (70.3 kg)  02/02/16 150 lb 3.2 oz (68.1 kg)  10/10/15 150 lb 2 oz (68.1 kg)     Lab Results  Component Value Date   WBC 5.8 04/10/2015   HGB 13.0 09/30/2013   HCT 39.3 04/10/2015   PLT 218 04/10/2015   GLUCOSE 89 04/10/2015   CHOL 211 (H) 04/10/2015   TRIG 107 04/10/2015   HDL 59 04/10/2015   LDLDIRECT 158.1 03/18/2013   LDLCALC 131 (H) 04/10/2015   ALT 13 04/10/2015   AST 14 04/10/2015   NA 143 04/10/2015   K 4.2 04/10/2015   CL 101 04/10/2015   CREATININE 0.70 04/10/2015   BUN 11 04/10/2015   CO2 25 04/10/2015   TSH 1.180 04/10/2015    Mm Digital Screening Bilateral  Result Date: 05/04/2015 CLINICAL DATA:  Screening. EXAM: DIGITAL SCREENING BILATERAL MAMMOGRAM WITH CAD COMPARISON:  Previous exam(s). ACR Breast Density Category b: There are scattered areas of fibroglandular density. FINDINGS: There are no findings suspicious for malignancy. Images were processed with CAD. IMPRESSION: No mammographic evidence of malignancy. A result letter of this screening mammogram will be mailed directly to the patient. RECOMMENDATION: Screening mammogram in one year. (Code:SM-B-01Y) BI-RADS CATEGORY  1: Negative. Electronically Signed   By: Franki Cabot M.D.   On: 05/04/2015 09:31       Assessment & Plan:   Problem List Items  Addressed This Visit    Health care maintenance    Physical today 04/16/16.  Colonoscopy 12/2011.  Mammogram 05/04/15 - Birads I.  Scheduled for f/u mammogram.  PAP 03/18/13 - negative with negative HPV.  Declined pap today.        Hypercholesterolemia    Low cholesterol diet and exercise.  Follow lipid panel.  She declines cholesterol medication.        Relevant Orders   Hepatic function panel   Lipid panel   Basic metabolic panel   Stress    Increased stress recently as outlined.  Discussed with her today.  Has good support.  Follow.  Does not feel needs any further intervention.  Relevant Orders   CBC with Differential/Platelet   TSH   Vitamin D deficiency    Follow vitamin D level.        Relevant Orders   VITAMIN D 25 Hydroxy (Vit-D Deficiency, Fractures)    Other Visit Diagnoses    Encounter for breast cancer screening other than mammogram    -  Primary   Relevant Orders   MM Digital Screening       Einar Pheasant, MD

## 2016-04-16 NOTE — Progress Notes (Signed)
Pre visit review using our clinic review tool, if applicable. No additional management support is needed unless otherwise documented below in the visit note. 

## 2016-04-21 ENCOUNTER — Encounter: Payer: Self-pay | Admitting: Internal Medicine

## 2016-04-21 NOTE — Assessment & Plan Note (Signed)
Low cholesterol diet and exercise.  Follow lipid panel.  She declines cholesterol medication.   

## 2016-04-21 NOTE — Assessment & Plan Note (Signed)
Follow vitamin D level.  

## 2016-04-21 NOTE — Assessment & Plan Note (Signed)
Increased stress recently as outlined.  Discussed with her today.  Has good support.  Follow.  Does not feel needs any further intervention.

## 2016-04-21 NOTE — Assessment & Plan Note (Signed)
Physical today 04/16/16.  Colonoscopy 12/2011.  Mammogram 05/04/15 - Birads I.  Scheduled for f/u mammogram.  PAP 03/18/13 - negative with negative HPV.  Declined pap today.

## 2016-05-03 ENCOUNTER — Other Ambulatory Visit: Payer: Self-pay | Admitting: Internal Medicine

## 2016-05-04 LAB — COMPREHENSIVE METABOLIC PANEL
ALK PHOS: 87 IU/L (ref 39–117)
ALT: 14 IU/L (ref 0–32)
AST: 17 IU/L (ref 0–40)
Albumin/Globulin Ratio: 1.9 (ref 1.2–2.2)
Albumin: 4.3 g/dL (ref 3.5–5.5)
BILIRUBIN TOTAL: 0.6 mg/dL (ref 0.0–1.2)
BUN/Creatinine Ratio: 20 (ref 9–23)
BUN: 14 mg/dL (ref 6–24)
CO2: 26 mmol/L (ref 18–29)
CREATININE: 0.69 mg/dL (ref 0.57–1.00)
Calcium: 9.4 mg/dL (ref 8.7–10.2)
Chloride: 104 mmol/L (ref 96–106)
GFR calc Af Amer: 113 mL/min/{1.73_m2} (ref >59)
GFR calc non Af Amer: 98 mL/min/{1.73_m2} (ref >59)
GLUCOSE: 90 mg/dL (ref 65–99)
Globulin, Total: 2.3 g/dL (ref 1.5–4.5)
Potassium: 4.8 mmol/L (ref 3.5–5.2)
Sodium: 144 mmol/L (ref 134–144)
Total Protein: 6.6 g/dL (ref 6.0–8.5)

## 2016-05-04 LAB — CBC WITH DIFFERENTIAL/PLATELET
BASOS ABS: 0 10*3/uL (ref 0.0–0.2)
Basos: 0 %
EOS (ABSOLUTE): 0.1 10*3/uL (ref 0.0–0.4)
Eos: 3 %
Hematocrit: 37.9 % (ref 34.0–46.6)
Hemoglobin: 12.4 g/dL (ref 11.1–15.9)
IMMATURE GRANS (ABS): 0 10*3/uL (ref 0.0–0.1)
Immature Granulocytes: 0 %
LYMPHS ABS: 1.5 10*3/uL (ref 0.7–3.1)
LYMPHS: 28 %
MCH: 30 pg (ref 26.6–33.0)
MCHC: 32.7 g/dL (ref 31.5–35.7)
MCV: 92 fL (ref 79–97)
MONOCYTES: 5 %
Monocytes Absolute: 0.3 10*3/uL (ref 0.1–0.9)
NEUTROS ABS: 3.4 10*3/uL (ref 1.4–7.0)
Neutrophils: 64 %
PLATELETS: 207 10*3/uL (ref 150–379)
RBC: 4.14 x10E6/uL (ref 3.77–5.28)
RDW: 12.1 % — ABNORMAL LOW (ref 12.3–15.4)
WBC: 5.3 10*3/uL (ref 3.4–10.8)

## 2016-05-04 LAB — LIPID PANEL WITH LDL/HDL RATIO
CHOLESTEROL TOTAL: 213 mg/dL — AB (ref 100–199)
HDL: 68 mg/dL (ref >39)
LDL Calculated: 126 mg/dL — ABNORMAL HIGH (ref 0–99)
LDl/HDL Ratio: 1.9 ratio units (ref 0.0–3.2)
TRIGLYCERIDES: 94 mg/dL (ref 0–149)
VLDL CHOLESTEROL CAL: 19 mg/dL (ref 5–40)

## 2016-05-04 LAB — VITAMIN D 25 HYDROXY (VIT D DEFICIENCY, FRACTURES): Vit D, 25-Hydroxy: 20.9 ng/mL — ABNORMAL LOW (ref 30.0–100.0)

## 2016-05-04 LAB — TSH: TSH: 1.85 u[IU]/mL (ref 0.450–4.500)

## 2016-05-04 LAB — HEPATIC FUNCTION PANEL: Bilirubin, Direct: 0.12 mg/dL (ref 0.00–0.40)

## 2016-05-22 ENCOUNTER — Ambulatory Visit
Admission: RE | Admit: 2016-05-22 | Discharge: 2016-05-22 | Disposition: A | Discharge status: Home / Self Care | Payer: 59 | Source: Ambulatory Visit | Attending: Internal Medicine | Admitting: Internal Medicine

## 2016-05-22 DIAGNOSIS — Z1239 Encounter for other screening for malignant neoplasm of breast: Secondary | ICD-10-CM

## 2016-05-22 DIAGNOSIS — Z1231 Encounter for screening mammogram for malignant neoplasm of breast: Secondary | ICD-10-CM | POA: Insufficient documentation

## 2016-10-24 ENCOUNTER — Ambulatory Visit (INDEPENDENT_AMBULATORY_CARE_PROVIDER_SITE_OTHER): Payer: 59 | Admitting: Internal Medicine

## 2016-10-24 ENCOUNTER — Encounter: Payer: Self-pay | Admitting: Internal Medicine

## 2016-10-24 VITALS — BP 118/78 | HR 84 | Temp 98.6°F | Resp 12 | Ht 62.0 in | Wt 157.4 lb

## 2016-10-24 DIAGNOSIS — R109 Unspecified abdominal pain: Secondary | ICD-10-CM

## 2016-10-24 DIAGNOSIS — E7800 Pure hypercholesterolemia, unspecified: Secondary | ICD-10-CM | POA: Diagnosis not present

## 2016-10-24 DIAGNOSIS — E559 Vitamin D deficiency, unspecified: Secondary | ICD-10-CM

## 2016-10-24 DIAGNOSIS — F439 Reaction to severe stress, unspecified: Secondary | ICD-10-CM | POA: Diagnosis not present

## 2016-10-24 MED ORDER — SERTRALINE HCL 50 MG PO TABS: 50.0000 mg | ORAL_TABLET | Freq: Every day | ORAL | 1 refills | Status: DC

## 2016-10-24 NOTE — Patient Instructions (Signed)
miralax daily if needed.

## 2016-10-24 NOTE — Progress Notes (Signed)
Patient ID: Meghan Valdez, female   DOB: 08-11-1960, 56 y.o.   MRN: [id number]   Subjective:    Patient ID: Meghan Valdez, female    DOB: [birth year], 56 y.o.   MRN: [id number]  HPI  Patient here for a scheduled follow up.  She reports increased stress.  Going through separation.  Discussed at length with her today.  She feels she needs something to help level things out.  Has taken zoloft previously and it worked well for her.  No chest pain.  No sob.  No acid reflux.  No abdominal pain.  Bowels moving. Not sleeping well.  Trouble staying asleep.  Does not have trouble falling asleep.     Past Medical History:  Diagnosis Date  . Allergic rhinitis   . Basal cell carcinoma of skin    removed 1998  . Chicken pox   . Chronic sinusitis   . History of migraine headaches   . Hypercholesterolemia   . Migraines    Past Surgical History:  Procedure Laterality Date  . BREAST BIOPSY Left 1986   benign  . BREAST LUMPECTOMY  1986   benign  . CESAREAN SECTION  1992  . LAPAROSCOPIC CHOLECYSTECTOMY  2008  . LIPOSUCTION    . SEPTOPLASTY  1993  . SKIN CANCER EXCISION  1998   basal cell carcinoma of the scalp  . TOTAL ABDOMINAL HYSTERECTOMY W/ BILATERAL SALPINGOOPHORECTOMY     lysis of adhesions   Family History  Problem Relation Age of Onset  . Rheumatic fever Father   . Hypertension Mother   . Hyperlipidemia Mother   . Diabetes Mother   . Hypertension Maternal Grandmother   . Diabetes Brother   . Breast cancer Maternal Aunt        great  . Breast cancer Paternal Aunt        great  . Colon cancer Neg Hx    Social History   Social History  . Marital status: Married    Spouse name: N/A  . Number of children: 2  . Years of education: N/A   Occupational History  . retired - med Designer, multimedia    Social History Main Topics  . Smoking status: Never Smoker  . Smokeless tobacco: Never Used  . Alcohol use 0.0 oz/week  . Drug use: No  . Sexual activity: Not Asked   Other Topics  Concern  . None   Social History Narrative  . None    Outpatient Encounter Prescriptions as of 10/24/2016  Medication Sig  . fluticasone (FLONASE) 50 MCG/ACT nasal spray Place 2 sprays into both nostrils daily.  Marland Kitchen ALPRAZolam (XANAX) 0.25 MG tablet Take 1 tablet (0.25 mg total) by mouth 2 (two) times daily as needed for anxiety. (Patient not taking: Reported on 10/24/2016)  . estradiol (ESTRACE VAGINAL) 0.1 MG/GM vaginal cream   . sertraline (ZOLOFT) 50 MG tablet Take 1 tablet (50 mg total) by mouth daily.   No facility-administered encounter medications on file as of 10/24/2016.     Review of Systems  Constitutional: Negative for appetite change and unexpected weight change.  HENT: Negative for congestion and sinus pressure.   Respiratory: Negative for cough, chest tightness and shortness of breath.   Cardiovascular: Negative for chest pain, palpitations and leg swelling.  Gastrointestinal: Negative for abdominal pain, diarrhea, nausea and vomiting.  Genitourinary: Negative for difficulty urinating and dysuria.  Musculoskeletal: Negative for back pain and joint swelling.  Skin: Negative for color change and rash.  Neurological: Negative  for dizziness, light-headedness and headaches.  Psychiatric/Behavioral: Negative for agitation and dysphoric mood.       Objective:     Blood pressure rechecked by me:  124/82  Physical Exam  Constitutional: She appears well-developed and well-nourished. No distress.  HENT:  Nose: Nose normal.  Mouth/Throat: Oropharynx is clear and moist.  Neck: Neck supple. No thyromegaly present.  Cardiovascular: Normal rate and regular rhythm.   Pulmonary/Chest: Breath sounds normal. No respiratory distress. She has no wheezes.  Abdominal: Soft. Bowel sounds are normal. There is no tenderness.  Musculoskeletal: She exhibits no edema or tenderness.  Lymphadenopathy:    She has no cervical adenopathy.  Skin: No rash noted. No erythema.  Psychiatric: She  has a normal mood and affect. Her behavior is normal.    BP 118/78 (BP Location: Left Arm, Patient Position: Sitting, Cuff Size: Normal)   Pulse 84   Temp 98.6 F (37 C) (Oral)   Resp 12   Ht 5\' 2"  (1.575 m)   Wt 157 lb 6.4 oz (71.4 kg)   SpO2 98%   BMI 28.79 kg/m  Wt Readings from Last 3 Encounters:  10/24/16 157 lb 6.4 oz (71.4 kg)  04/16/16 155 lb (70.3 kg)  02/02/16 150 lb 3.2 oz (68.1 kg)     Lab Results  Component Value Date   WBC 5.3 05/03/2016   HGB 12.4 05/03/2016   HCT 37.9 05/03/2016   PLT 207 05/03/2016   GLUCOSE 90 05/03/2016   CHOL 213 (H) 05/03/2016   TRIG 94 05/03/2016   HDL 68 05/03/2016   LDLDIRECT 158.1 03/18/2013   LDLCALC 126 (H) 05/03/2016   ALT 14 05/03/2016   AST 17 05/03/2016   NA 144 05/03/2016   K 4.8 05/03/2016   CL 104 05/03/2016   CREATININE 0.69 05/03/2016   BUN 14 05/03/2016   CO2 26 05/03/2016   TSH 1.850 05/03/2016    Mm Digital Screening  Result Date: 05/23/2016 CLINICAL DATA:  Screening. EXAM: DIGITAL SCREENING BILATERAL MAMMOGRAM WITH CAD COMPARISON:  Previous exam(s). ACR Breast Density Category b: There are scattered areas of fibroglandular density. FINDINGS: There are no findings suspicious for malignancy. Images were processed with CAD. IMPRESSION: No mammographic evidence of malignancy. A result letter of this screening mammogram will be mailed directly to the patient. RECOMMENDATION: Screening mammogram in one year. (Code:SM-B-01Y) BI-RADS CATEGORY  1: Negative. Electronically Signed   By: Pamelia Hoit M.D.   On: 05/23/2016 08:10       Assessment & Plan:   Problem List Items Addressed This Visit    Hypercholesterolemia    Low cholesterol diet and exercise.  Follow lipid panel.        Stress    Increased stress as outlined.  Discussed with her today.  Will start zoloft as directed.  Follow.  Get her back in soon to reassess.        Vitamin D deficiency - Primary    Follow vitamin D level.        Relevant Orders    VITAMIN D 25 Hydroxy (Vit-D Deficiency, Fractures)    Other Visit Diagnoses    Abdominal pain, unspecified abdominal location       Relevant Orders   Hepatic function panel   Basic metabolic panel       Einar Pheasant, MD

## 2016-10-24 NOTE — Progress Notes (Signed)
Pre-visit discussion using our clinic review tool. No additional management support is needed unless otherwise documented below in the visit note.  

## 2016-10-26 ENCOUNTER — Encounter: Payer: Self-pay | Admitting: Internal Medicine

## 2016-10-26 NOTE — Assessment & Plan Note (Signed)
Increased stress as outlined.  Discussed with her today.  Will start zoloft as directed.  Follow.  Get her back in soon to reassess.

## 2016-10-26 NOTE — Assessment & Plan Note (Signed)
Follow vitamin D level.  

## 2016-10-26 NOTE — Assessment & Plan Note (Signed)
Low cholesterol diet and exercise.  Follow lipid panel.   

## 2016-12-16 ENCOUNTER — Encounter: Payer: Self-pay | Admitting: Internal Medicine

## 2016-12-30 ENCOUNTER — Ambulatory Visit: Payer: 59 | Admitting: Internal Medicine

## 2017-01-20 ENCOUNTER — Telehealth: Payer: Self-pay | Admitting: Internal Medicine

## 2017-01-20 NOTE — Telephone Encounter (Signed)
Appointment Request From: [place] L. Bobbye [place]    With Provider: Einar Pheasant, MD [facility] [place]]    Preferred Date Range: From 01/20/2017 To 01/20/2017    Preferred Times: Any    Reason for visit: Office Visit    Comments:  Requesting a sick visit.   Since Friday I've had pain and pressure in my groin, back, and abdomen like I've never felt before. It's sharp at times but not constant. I can't feel that my bladder's full, and it's painful in my groin when I urinate. At one point I could't feel the urine passing. There's no burning pain or usual UTI symptoms.  Sat the fever and chills began. 101.5 has been the highest yet it took all day under a blanket before I felt warm. I woke last night with my hair drenched and my full body wet with sweat, yet I didn't feel hot. 600mg  of ibuprofen twice a day has greatly reduced the pain.

## 2017-01-20 NOTE — Telephone Encounter (Signed)
[phone] °

## 2017-01-20 NOTE — Telephone Encounter (Signed)
Spoke with patient. Symptoms since Friday. Sharp pain but not constant. She can not feel that her bladder is full and has pain in groin with urination. Denies burning pain or usual UTI symptoms. Spiked a fever of 101.5 on Saturday and was having chills. She currently has no fever and requesting appointment for tomorrow. Scheduled patient for 1:15 tomorrow with Lacinda Axon and advised patient that if she spiked a fever or symptoms worsened.

## 2017-01-20 NOTE — Telephone Encounter (Signed)
Left message to return call to office.

## 2017-01-21 ENCOUNTER — Ambulatory Visit (INDEPENDENT_AMBULATORY_CARE_PROVIDER_SITE_OTHER): Payer: 59 | Admitting: Family Medicine

## 2017-01-21 ENCOUNTER — Encounter: Payer: Self-pay | Admitting: Family Medicine

## 2017-01-21 ENCOUNTER — Other Ambulatory Visit: Payer: Self-pay

## 2017-01-21 DIAGNOSIS — R102 Pelvic and perineal pain: Secondary | ICD-10-CM | POA: Diagnosis not present

## 2017-01-21 LAB — POC URINALSYSI DIPSTICK (AUTOMATED)
Glucose, UA: NEGATIVE
KETONES UA: 40
Nitrite, UA: NEGATIVE
PH UA: 5.5 (ref 5.0–8.0)
PROTEIN UA: NEGATIVE
SPEC GRAV UA: 1.02 (ref 1.010–1.025)
UROBILINOGEN UA: 1 U/dL

## 2017-01-21 MED ORDER — CIPROFLOXACIN HCL 500 MG PO TABS: 500.0000 mg | ORAL_TABLET | Freq: Two times a day (BID) | ORAL | 0 refills | Status: DC

## 2017-01-21 NOTE — Progress Notes (Signed)
Subjective:  Patient ID: Meghan Valdez, female    DOB: 30-Jun-1960  Age: 56 y.o. MRN: [id number]  CC: Abdominal pain/pressure, fever  HPI:  56 year old female with a history of recurrent UTI presents with the above complaints.  Patient states she has not been feeling well since Friday. She states that she's had suprapubic abdominal pain/pressure. No dysuria. She does state that she does not produce much urine when she feels the urge to go. She had a fever on Sunday with associated chills. She's had no further fever. Associated back pain. No GI symptoms. No known exacerbating or relieving factors. No other associated symptoms. No other complaints concerns at this time.  Social Hx   Social History   Social History  . Marital status: Married    Spouse name: N/A  . Number of children: 2  . Years of education: N/A   Occupational History  . retired - med Designer, multimedia    Social History Main Topics  . Smoking status: Never Smoker  . Smokeless tobacco: Never Used  . Alcohol use 0.0 oz/week  . Drug use: No  . Sexual activity: Not Asked   Other Topics Concern  . None   Social History Narrative  . None    Review of Systems  Constitutional: Positive for fever.  Gastrointestinal: Positive for abdominal pain.  Genitourinary: Positive for pelvic pain.   Objective:  BP 122/76 (BP Location: Left Arm, Patient Position: Sitting, Cuff Size: Normal)   Pulse 81   Temp 98.7 F (37.1 C) (Oral)   Wt 155 lb (70.3 kg)   SpO2 98%   BMI 28.35 kg/m   BP/Weight 01/21/2017 10/24/2016 69/10/2950  Systolic BP 841 324 401  Diastolic BP 76 78 78  Wt. (Lbs) 155 157.4 155  BMI 28.35 28.79 28.35    Physical Exam  Constitutional: She is oriented to person, place, and time. She appears well-developed. No distress.  Cardiovascular: Normal rate and regular rhythm.   2/6 systolic murmur.  Pulmonary/Chest: Effort normal. She has no wheezes. She has no rales.  Abdominal:  Soft, nondistended.  Suprapubic tenderness noted. CVA tenderness bilaterally, right greater than left.  Neurological: She is alert and oriented to person, place, and time.  Psychiatric: She has a normal mood and affect.  Vitals reviewed.   Lab Results  Component Value Date   WBC 5.3 05/03/2016   HGB 12.4 05/03/2016   HCT 37.9 05/03/2016   PLT 207 05/03/2016   GLUCOSE 90 05/03/2016   CHOL 213 (H) 05/03/2016   TRIG 94 05/03/2016   HDL 68 05/03/2016   LDLDIRECT 158.1 03/18/2013   LDLCALC 126 (H) 05/03/2016   ALT 14 05/03/2016   AST 17 05/03/2016   NA 144 05/03/2016   K 4.8 05/03/2016   CL 104 05/03/2016   CREATININE 0.69 05/03/2016   BUN 14 05/03/2016   CO2 26 05/03/2016   TSH 1.850 05/03/2016   Results for orders placed or performed in visit on 01/21/17 (from the past 24 hour(s))  POCT Urinalysis Dipstick (Automated)     Status: Abnormal   Collection Time: 01/21/17  1:28 PM  Result Value Ref Range   Color, UA yellow    Clarity, UA clear    Glucose, UA negative    Bilirubin, UA small    Ketones, UA 40    Spec Grav, UA 1.020 1.010 - 1.025   Blood, UA small    pH, UA 5.5 5.0 - 8.0   Protein, UA negative  Urobilinogen, UA 1.0 0.2 or 1.0 E.U./dL   Nitrite, UA negative    Leukocytes, UA Trace (A) Negative    Assessment & Plan:   Problem List Items Addressed This Visit    Acute suprapubic pain    New problem. CVA tenderness noted. Recent fever. Urinalysis with leukocytes and blood. Concern for UTI. Treating empirically with Cipro while awaiting culture.      Relevant Orders   POCT Urinalysis Dipstick (Automated) (Completed)   Urine Culture      Meds ordered this encounter  Medications  . ibuprofen (ADVIL,MOTRIN) 600 MG tablet    Sig: Take 600 mg by mouth every 6 (six) hours as needed.  . Simethicone (GAS-X EXTRA STRENGTH) 125 MG CAPS    Sig: Take by mouth as needed.  . ciprofloxacin (CIPRO) 500 MG tablet    Sig: Take 1 tablet (500 mg total) by mouth 2 (two) times daily.     Dispense:  14 tablet    Refill:  0   Follow-up: PRN  [place]

## 2017-01-21 NOTE — Assessment & Plan Note (Signed)
New problem. CVA tenderness noted. Recent fever. Urinalysis with leukocytes and blood. Concern for UTI. Treating empirically with Cipro while awaiting culture.

## 2017-01-21 NOTE — Telephone Encounter (Signed)
Patient calls stating medication sent to wrong pharmacy medication sent to correct pharmacy.

## 2017-01-21 NOTE — Patient Instructions (Signed)
Antibiotic as prescribed.  Call with concerns.  Take care  Dr. Jamine Highfill  

## 2017-01-22 LAB — URINE CULTURE
MICRO NUMBER:: 80999929
SPECIMEN QUALITY: ADEQUATE

## 2017-04-18 ENCOUNTER — Ambulatory Visit (INDEPENDENT_AMBULATORY_CARE_PROVIDER_SITE_OTHER): Payer: 59 | Admitting: Internal Medicine

## 2017-04-18 ENCOUNTER — Encounter: Payer: Self-pay | Admitting: Internal Medicine

## 2017-04-18 VITALS — BP 118/82 | HR 84 | Temp 98.4°F | Resp 16 | Ht 60.04 in | Wt 158.4 lb

## 2017-04-18 DIAGNOSIS — Z0000 Encounter for general adult medical examination without abnormal findings: Secondary | ICD-10-CM | POA: Diagnosis not present

## 2017-04-18 DIAGNOSIS — F439 Reaction to severe stress, unspecified: Secondary | ICD-10-CM

## 2017-04-18 DIAGNOSIS — R252 Cramp and spasm: Secondary | ICD-10-CM

## 2017-04-18 DIAGNOSIS — Z1231 Encounter for screening mammogram for malignant neoplasm of breast: Secondary | ICD-10-CM

## 2017-04-18 DIAGNOSIS — E7800 Pure hypercholesterolemia, unspecified: Secondary | ICD-10-CM | POA: Diagnosis not present

## 2017-04-18 DIAGNOSIS — Z1239 Encounter for other screening for malignant neoplasm of breast: Secondary | ICD-10-CM

## 2017-04-18 DIAGNOSIS — E559 Vitamin D deficiency, unspecified: Secondary | ICD-10-CM

## 2017-04-18 NOTE — Assessment & Plan Note (Addendum)
Physical today 12//7/18.  Colonoscopy 12/2011.  mammogram 05/23/16 - birads I.  She will call and schedule her own mammogram.

## 2017-04-18 NOTE — Progress Notes (Signed)
Patient ID: Meghan Valdez, female   DOB: August 28, 1960, 56 y.o.   MRN: [id number]   Subjective:    Patient ID: Meghan Valdez, female    DOB: 22-Aug-1960, 56 y.o.   MRN: [id number]  HPI  Patient here for her physical exam.  States she is doing relatively well.  Handling stress.  Trying to stay active.  Working.  Job is going well, but increased stress with her job.  A lot of job openings.  No chest pain.  No sob.  No acid reflux.  No abdominal pain.  Bowels moving.  Has noticed some muscle cramps.  Intermittent.  Overall she feels she is doing relatively well.     Past Medical History:  Diagnosis Date  . Allergic rhinitis   . Basal cell carcinoma of skin    removed 1998  . Chicken pox   . Chronic sinusitis   . History of migraine headaches   . Hypercholesterolemia   . Migraines    Past Surgical History:  Procedure Laterality Date  . BREAST BIOPSY Left 1986   benign  . BREAST LUMPECTOMY  1986   benign  . CESAREAN SECTION  1992  . LAPAROSCOPIC CHOLECYSTECTOMY  2008  . LIPOSUCTION    . SEPTOPLASTY  1993  . SKIN CANCER EXCISION  1998   basal cell carcinoma of the scalp  . TOTAL ABDOMINAL HYSTERECTOMY W/ BILATERAL SALPINGOOPHORECTOMY     lysis of adhesions   Family History  Problem Relation Age of Onset  . Rheumatic fever Father   . Hypertension Mother   . Hyperlipidemia Mother   . Diabetes Mother   . Hypertension Maternal Grandmother   . Diabetes Brother   . Breast cancer Maternal Aunt        great  . Breast cancer Paternal Aunt        great  . Colon cancer Neg Hx    Social History   Socioeconomic History  . Marital status: Married    Spouse name: None  . Number of children: 2  . Years of education: None  . Highest education level: None  Social Needs  . Financial resource strain: None  . Food insecurity - worry: None  . Food insecurity - inability: None  . Transportation needs - medical: None  . Transportation needs - non-medical: None  Occupational  History  . Occupation: retired - med Research officer, political party  . Smoking status: Never Smoker  . Smokeless tobacco: Never Used  Substance and Sexual Activity  . Alcohol use: Yes    Alcohol/week: 0.0 oz  . Drug use: No  . Sexual activity: None  Other Topics Concern  . None  Social History Narrative  . None    Outpatient Encounter Medications as of 04/18/2017  Medication Sig  . ibuprofen (ADVIL,MOTRIN) 600 MG tablet Take 600 mg by mouth every 6 (six) hours as needed.  . Simethicone (GAS-X EXTRA STRENGTH) 125 MG CAPS Take by mouth as needed.  . [DISCONTINUED] ciprofloxacin (CIPRO) 500 MG tablet Take 1 tablet (500 mg total) by mouth 2 (two) times daily.  . [DISCONTINUED] fluticasone (FLONASE) 50 MCG/ACT nasal spray Place 2 sprays into both nostrils daily.   No facility-administered encounter medications on file as of 04/18/2017.     Review of Systems  Constitutional: Negative for appetite change and unexpected weight change.  HENT: Negative for congestion and sinus pressure.   Eyes: Negative for pain and visual disturbance.  Respiratory: Negative for cough, chest tightness and shortness of  breath.   Cardiovascular: Negative for chest pain, palpitations and leg swelling.  Gastrointestinal: Negative for abdominal pain, diarrhea, nausea and vomiting.  Genitourinary: Negative for difficulty urinating and dysuria.  Musculoskeletal: Negative for back pain and joint swelling.       Muscle cramps as outlined.    Skin: Negative for color change and rash.  Neurological: Negative for dizziness, light-headedness and headaches.  Hematological: Negative for adenopathy. Does not bruise/bleed easily.  Psychiatric/Behavioral: Negative for agitation and dysphoric mood.       Objective:     Blood pressure rechecked by me:  128/82  Physical Exam  Constitutional: She is oriented to person, place, and time. She appears well-developed and well-nourished. No distress.  HENT:  Nose: Nose normal.    Mouth/Throat: Oropharynx is clear and moist.  Eyes: Right eye exhibits no discharge. Left eye exhibits no discharge. No scleral icterus.  Neck: Neck supple. No thyromegaly present.  Cardiovascular: Normal rate and regular rhythm.  Pulmonary/Chest: Breath sounds normal. No accessory muscle usage. No tachypnea. No respiratory distress. She has no decreased breath sounds. She has no wheezes. She has no rhonchi. Right breast exhibits no inverted nipple, no mass, no nipple discharge and no tenderness (no axillary adenopathy). Left breast exhibits no inverted nipple, no mass, no nipple discharge and no tenderness (no axilarry adenopathy).  Abdominal: Soft. Bowel sounds are normal. There is no tenderness.  Musculoskeletal: She exhibits no edema or tenderness.  Lymphadenopathy:    She has no cervical adenopathy.  Neurological: She is alert and oriented to person, place, and time.  Skin: Skin is warm. No rash noted. No erythema.  Psychiatric: She has a normal mood and affect. Her behavior is normal.    BP 118/82   Pulse 84   Temp 98.4 F (36.9 C)   Resp 16   Ht 5' 0.04" (1.525 m)   Wt 158 lb 6 oz (71.8 kg)   SpO2 97%   BMI 30.89 kg/m  Wt Readings from Last 3 Encounters:  04/18/17 158 lb 6 oz (71.8 kg)  01/21/17 155 lb (70.3 kg)  10/24/16 157 lb 6.4 oz (71.4 kg)     Lab Results  Component Value Date   WBC 5.3 05/03/2016   HGB 12.4 05/03/2016   HCT 37.9 05/03/2016   PLT 207 05/03/2016   GLUCOSE 90 05/03/2016   CHOL 213 (H) 05/03/2016   TRIG 94 05/03/2016   HDL 68 05/03/2016   LDLDIRECT 158.1 03/18/2013   LDLCALC 126 (H) 05/03/2016   ALT 14 05/03/2016   AST 17 05/03/2016   NA 144 05/03/2016   K 4.8 05/03/2016   CL 104 05/03/2016   CREATININE 0.69 05/03/2016   BUN 14 05/03/2016   CO2 26 05/03/2016   TSH 1.850 05/03/2016    Mm Digital Screening  Result Date: 05/23/2016 CLINICAL DATA:  Screening. EXAM: DIGITAL SCREENING BILATERAL MAMMOGRAM WITH CAD COMPARISON:  Previous  exam(s). ACR Breast Density Category b: There are scattered areas of fibroglandular density. FINDINGS: There are no findings suspicious for malignancy. Images were processed with CAD. IMPRESSION: No mammographic evidence of malignancy. A result letter of this screening mammogram will be mailed directly to the patient. RECOMMENDATION: Screening mammogram in one year. (Code:SM-B-01Y) BI-RADS CATEGORY  1: Negative. Electronically Signed   By: Pamelia Hoit M.D.   On: 05/23/2016 08:10       Assessment & Plan:   Problem List Items Addressed This Visit    Health care maintenance    Physical today 12//7/18.  Colonoscopy 12/2011.  mammogram 05/23/16 - birads I.  She will call and schedule her own mammogram.        Hypercholesterolemia    Declines cholesterol medication.  Low cholesterol diet and exercise.  Follow lipid panel.        Relevant Orders   CBC with Differential/Platelet   Hepatic function panel   Lipid panel   TSH   Basic metabolic panel   Stress    Increased stress as outlined.  Overall handling things relatively well.  Does not feel needs anything more at this time.  Follow.        Vitamin D deficiency    Follow vitamin D level.        Relevant Orders   VITAMIN D 25 Hydroxy (Vit-D Deficiency, Fractures)    Other Visit Diagnoses    Routine general medical examination at a health care facility    -  Primary   Breast cancer screening       Relevant Orders   MM Digital Screening   Limb cramps       Relevant Orders   Magnesium       Einar Pheasant, MD

## 2017-04-21 ENCOUNTER — Encounter: Payer: Self-pay | Admitting: Internal Medicine

## 2017-04-21 NOTE — Assessment & Plan Note (Signed)
Increased stress as outlined.  Overall handling things relatively well.  Does not feel needs anything more at this time.  Follow.

## 2017-04-21 NOTE — Assessment & Plan Note (Signed)
Declines cholesterol medication.  Low cholesterol diet and exercise.  Follow lipid panel.   

## 2017-04-21 NOTE — Assessment & Plan Note (Signed)
Follow vitamin D level.  

## 2017-05-16 ENCOUNTER — Encounter: Payer: Self-pay | Admitting: Internal Medicine

## 2017-06-02 ENCOUNTER — Encounter: Payer: Self-pay | Admitting: Internal Medicine

## 2017-06-02 NOTE — Telephone Encounter (Signed)
Given that her last culture was negative, etc - I would prefer to see her.  I can see her tomorrow at 11:30 - if ok to wait.  Let me know if a problem.

## 2017-06-03 ENCOUNTER — Ambulatory Visit: Payer: Managed Care, Other (non HMO) | Admitting: Internal Medicine

## 2017-06-03 VITALS — BP 126/78 | HR 70 | Temp 98.0°F | Resp 16 | Wt 158.0 lb

## 2017-06-03 DIAGNOSIS — N390 Urinary tract infection, site not specified: Secondary | ICD-10-CM

## 2017-06-03 DIAGNOSIS — R300 Dysuria: Secondary | ICD-10-CM | POA: Diagnosis not present

## 2017-06-03 LAB — POCT URINALYSIS DIPSTICK
Bilirubin, UA: NEGATIVE
Glucose, UA: NEGATIVE
KETONES UA: NEGATIVE
NITRITE UA: NEGATIVE
Protein, UA: NEGATIVE
SPEC GRAV UA: 1.01 (ref 1.010–1.025)
UROBILINOGEN UA: 0.2 U/dL
pH, UA: 6 (ref 5.0–8.0)

## 2017-06-03 LAB — URINALYSIS, MICROSCOPIC ONLY

## 2017-06-03 MED ORDER — CIPROFLOXACIN HCL 500 MG PO TABS: 500.0000 mg | ORAL_TABLET | Freq: Two times a day (BID) | ORAL | 0 refills | Status: DC

## 2017-06-03 NOTE — Telephone Encounter (Signed)
Pt coming in at 11:30 today to see Dr. Nicki Reaper.

## 2017-06-03 NOTE — Patient Instructions (Signed)
Take a probiotic daily while you are on the antibiotics and for two weeks after completing the antibiotics.   

## 2017-06-03 NOTE — Progress Notes (Signed)
Patient ID: Meghan Valdez, female   DOB: 04/05/1961, 57 y.o.   MRN: [id number]   Subjective:    Patient ID: Meghan Valdez, female    DOB: [birth year], 57 y.o.   MRN: [id number]  HPI  Patient here as a work in with concerns regarding increased urgency and cloudy urine.  Has also noticed a bad odor to her urine.  No blood.  No vaginal symptoms.  No abdominal pain.   No nausea, vomiting or diarrhea.  Has taken AZO.  Also drinking cranberry juice.     Past Medical History:  Diagnosis Date  . Allergic rhinitis   . Basal cell carcinoma of skin    removed 1998  . Chicken pox   . Chronic sinusitis   . History of migraine headaches   . Hypercholesterolemia   . Migraines    Past Surgical History:  Procedure Laterality Date  . BREAST BIOPSY Left 1986   benign  . BREAST LUMPECTOMY  1986   benign  . CESAREAN SECTION  1992  . LAPAROSCOPIC CHOLECYSTECTOMY  2008  . LIPOSUCTION    . SEPTOPLASTY  1993  . SKIN CANCER EXCISION  1998   basal cell carcinoma of the scalp  . TOTAL ABDOMINAL HYSTERECTOMY W/ BILATERAL SALPINGOOPHORECTOMY     lysis of adhesions   Family History  Problem Relation Age of Onset  . Rheumatic fever Father   . Hypertension Mother   . Hyperlipidemia Mother   . Diabetes Mother   . Hypertension Maternal Grandmother   . Diabetes Brother   . Breast cancer Maternal Aunt        great  . Breast cancer Paternal Aunt        great  . Colon cancer Neg Hx    Social History   Socioeconomic History  . Marital status: Married    Spouse name: None  . Number of children: 2  . Years of education: None  . Highest education level: None  Social Needs  . Financial resource strain: None  . Food insecurity - worry: None  . Food insecurity - inability: None  . Transportation needs - medical: None  . Transportation needs - non-medical: None  Occupational History  . Occupation: retired - med Research officer, political party  . Smoking status: Never Smoker  . Smokeless tobacco: Never  Used  Substance and Sexual Activity  . Alcohol use: Yes    Alcohol/week: 0.0 oz  . Drug use: No  . Sexual activity: None  Other Topics Concern  . None  Social History Narrative  . None    Outpatient Encounter Medications as of 06/03/2017  Medication Sig  . ciprofloxacin (CIPRO) 500 MG tablet Take 1 tablet (500 mg total) by mouth 2 (two) times daily.  Marland Kitchen ibuprofen (ADVIL,MOTRIN) 600 MG tablet Take 600 mg by mouth every 6 (six) hours as needed.  . Simethicone (GAS-X EXTRA STRENGTH) 125 MG CAPS Take by mouth as needed.   No facility-administered encounter medications on file as of 06/03/2017.     Review of Systems  Constitutional: Negative for appetite change and fever.  Gastrointestinal: Negative for abdominal pain, diarrhea, nausea and vomiting.  Genitourinary: Positive for dysuria. Negative for vaginal discharge.       Urgency.   Musculoskeletal: Negative for back pain and myalgias.  Psychiatric/Behavioral: Negative for agitation and dysphoric mood.       Objective:    Physical Exam  Constitutional: She appears well-developed and well-nourished. No distress.  Cardiovascular: Normal rate and regular  rhythm.  Pulmonary/Chest: Breath sounds normal. No respiratory distress. She has no wheezes.  Abdominal: Soft. Bowel sounds are normal. There is no tenderness.  Musculoskeletal:  No back tenderness to palpation.     BP 126/78 (BP Location: Left Arm, Patient Position: Sitting, Cuff Size: Normal)   Pulse 70   Temp 98 F (36.7 C) (Oral)   Resp 16   Wt 158 lb (71.7 kg)   SpO2 99%   BMI 30.82 kg/m  Wt Readings from Last 3 Encounters:  06/03/17 158 lb (71.7 kg)  04/18/17 158 lb 6 oz (71.8 kg)  01/21/17 155 lb (70.3 kg)     Lab Results  Component Value Date   WBC 5.3 05/03/2016   HGB 12.4 05/03/2016   HCT 37.9 05/03/2016   PLT 207 05/03/2016   GLUCOSE 90 05/03/2016   CHOL 213 (H) 05/03/2016   TRIG 94 05/03/2016   HDL 68 05/03/2016   LDLDIRECT 158.1 03/18/2013    LDLCALC 126 (H) 05/03/2016   ALT 14 05/03/2016   AST 17 05/03/2016   NA 144 05/03/2016   K 4.8 05/03/2016   CL 104 05/03/2016   CREATININE 0.69 05/03/2016   BUN 14 05/03/2016   CO2 26 05/03/2016   TSH 1.850 05/03/2016        Assessment & Plan:   Problem List Items Addressed This Visit    Frequent UTI    Discussed with her today.  Discussed urology referral.  Treat current infection.  Follow.        Other Visit Diagnoses    Dysuria    -  Primary   Symptoms as outlined.  urine dip reviewed.  treat with cipro as directed.  probiotics.  await culture results.     Relevant Orders   POCT Urinalysis Dipstick (Completed)   Urine Culture (Completed)   Urine Microscopic (Completed)       Einar Pheasant, MD

## 2017-06-05 ENCOUNTER — Encounter: Payer: Self-pay | Admitting: *Deleted

## 2017-06-05 LAB — URINE CULTURE
MICRO NUMBER:: 90090562
SPECIMEN QUALITY:: ADEQUATE

## 2017-06-06 ENCOUNTER — Encounter: Payer: Self-pay | Admitting: Internal Medicine

## 2017-06-06 NOTE — Assessment & Plan Note (Signed)
Discussed with her today.  Discussed urology referral.  Treat current infection.  Follow.

## 2017-06-09 NOTE — Telephone Encounter (Signed)
Unread mychart message mailed to patient 

## 2017-07-15 ENCOUNTER — Ambulatory Visit
Admission: RE | Admit: 2017-07-15 | Discharge: 2017-07-15 | Disposition: A | Discharge status: Home / Self Care | Payer: Managed Care, Other (non HMO) | Source: Ambulatory Visit | Attending: Internal Medicine | Admitting: Internal Medicine

## 2017-07-15 DIAGNOSIS — Z1231 Encounter for screening mammogram for malignant neoplasm of breast: Secondary | ICD-10-CM | POA: Diagnosis not present

## 2017-07-15 DIAGNOSIS — Z1239 Encounter for other screening for malignant neoplasm of breast: Secondary | ICD-10-CM

## 2017-09-05 ENCOUNTER — Encounter: Payer: Self-pay | Admitting: Internal Medicine

## 2017-09-05 ENCOUNTER — Ambulatory Visit: Payer: Managed Care, Other (non HMO) | Admitting: Internal Medicine

## 2017-09-05 VITALS — BP 126/64 | HR 62 | Temp 98.4°F | Resp 14 | Ht 60.04 in | Wt 158.8 lb

## 2017-09-05 DIAGNOSIS — R300 Dysuria: Secondary | ICD-10-CM | POA: Diagnosis not present

## 2017-09-05 LAB — POCT URINALYSIS DIPSTICK
Bilirubin, UA: NEGATIVE
GLUCOSE UA: NEGATIVE
KETONES UA: NEGATIVE
NITRITE UA: NEGATIVE
Protein, UA: NEGATIVE
SPEC GRAV UA: 1.025 (ref 1.010–1.025)
Urobilinogen, UA: 0.2 E.U./dL
pH, UA: 6 (ref 5.0–8.0)

## 2017-09-05 LAB — URINALYSIS, MICROSCOPIC ONLY

## 2017-09-05 MED ORDER — CIPROFLOXACIN HCL 500 MG PO TABS: 500.0000 mg | ORAL_TABLET | Freq: Two times a day (BID) | ORAL | 0 refills | Status: DC

## 2017-09-05 NOTE — Progress Notes (Signed)
Patient ID: Meghan Valdez, female   DOB: Nov 10, 1960, 57 y.o.   MRN: [id number]   Subjective:    Patient ID: Meghan Valdez, female    DOB: [birth year], 57 y.o.   MRN: [id number]  HPI  Patient here as a work in with concerns regarding a possible uti.  States symptoms started three days ago.  Increased frequency and dysuria.  No hematuria.  No back pian.  No abdominal pain.  Eating and drinking.  No vomiting.  No diarrhea.  Took AZO.  Drinking cranberry juice.     Past Medical History:  Diagnosis Date  . Allergic rhinitis   . Basal cell carcinoma of skin    removed 1998  . Chicken pox   . Chronic sinusitis   . History of migraine headaches   . Hypercholesterolemia   . Migraines    Past Surgical History:  Procedure Laterality Date  . BREAST EXCISIONAL BIOPSY Left 1986  . CESAREAN SECTION  1992  . LAPAROSCOPIC CHOLECYSTECTOMY  2008  . LIPOSUCTION    . SEPTOPLASTY  1993  . SKIN CANCER EXCISION  1998   basal cell carcinoma of the scalp  . TOTAL ABDOMINAL HYSTERECTOMY W/ BILATERAL SALPINGOOPHORECTOMY     lysis of adhesions   Family History  Problem Relation Age of Onset  . Rheumatic fever Father   . Hypertension Mother   . Hyperlipidemia Mother   . Diabetes Mother   . Hypertension Maternal Grandmother   . Diabetes Brother   . Breast cancer Maternal Aunt        great  . Breast cancer Paternal Aunt        great  . Colon cancer Neg Hx    Social History   Socioeconomic History  . Marital status: Married    Spouse name: Not on file  . Number of children: 2  . Years of education: Not on file  . Highest education level: Not on file  Occupational History  . Occupation: retired - med Youth worker  . Financial resource strain: Not on file  . Food insecurity:    Worry: Not on file    Inability: Not on file  . Transportation needs:    Medical: Not on file    Non-medical: Not on file  Tobacco Use  . Smoking status: Never Smoker  . Smokeless tobacco: Never  Used  Substance and Sexual Activity  . Alcohol use: Yes    Alcohol/week: 0.0 oz  . Drug use: No  . Sexual activity: Not on file  Lifestyle  . Physical activity:    Days per week: Not on file    Minutes per session: Not on file  . Stress: Not on file  Relationships  . Social connections:    Talks on phone: Not on file    Gets together: Not on file    Attends religious service: Not on file    Active member of club or organization: Not on file    Attends meetings of clubs or organizations: Not on file    Relationship status: Not on file  Other Topics Concern  . Not on file  Social History Narrative  . Not on file    Outpatient Encounter Medications as of 09/05/2017  Medication Sig  . ciprofloxacin (CIPRO) 500 MG tablet Take 1 tablet (500 mg total) by mouth 2 (two) times daily.  Marland Kitchen ibuprofen (ADVIL,MOTRIN) 600 MG tablet Take 600 mg by mouth every 6 (six) hours as needed.  . Simethicone (GAS-X  EXTRA STRENGTH) 125 MG CAPS Take by mouth as needed.  . [DISCONTINUED] ciprofloxacin (CIPRO) 500 MG tablet Take 1 tablet (500 mg total) by mouth 2 (two) times daily. (Patient not taking: Reported on 09/05/2017)   No facility-administered encounter medications on file as of 09/05/2017.     Review of Systems  Constitutional: Negative for appetite change and fever.  Gastrointestinal: Negative for abdominal pain, diarrhea, nausea and vomiting.  Genitourinary: Positive for dysuria and frequency. Negative for vaginal discharge.  Psychiatric/Behavioral: Negative for agitation and dysphoric mood.       Objective:    Physical Exam  Constitutional: She appears well-developed and well-nourished. No distress.  Neck: No thyromegaly present.  Cardiovascular: Normal rate and regular rhythm.  Pulmonary/Chest: Breath sounds normal. No respiratory distress. She has no wheezes.  Abdominal: Soft. Bowel sounds are normal. There is no tenderness.  Psychiatric: She has a normal mood and affect. Her behavior  is normal.    BP 126/64 (BP Location: Left Arm, Patient Position: Sitting, Cuff Size: Normal)   Pulse 62   Temp 98.4 F (36.9 C) (Oral)   Resp 14   Ht 5' 0.04" (1.525 m)   Wt 158 lb 12.8 oz (72 kg)   SpO2 97%   BMI 30.97 kg/m  Wt Readings from Last 3 Encounters:  09/05/17 158 lb 12.8 oz (72 kg)  06/03/17 158 lb (71.7 kg)  04/18/17 158 lb 6 oz (71.8 kg)     Lab Results  Component Value Date   WBC 5.3 05/03/2016   HGB 12.4 05/03/2016   HCT 37.9 05/03/2016   PLT 207 05/03/2016   GLUCOSE 90 05/03/2016   CHOL 213 (H) 05/03/2016   TRIG 94 05/03/2016   HDL 68 05/03/2016   LDLDIRECT 158.1 03/18/2013   LDLCALC 126 (H) 05/03/2016   ALT 14 05/03/2016   AST 17 05/03/2016   NA 144 05/03/2016   K 4.8 05/03/2016   CL 104 05/03/2016   CREATININE 0.69 05/03/2016   BUN 14 05/03/2016   CO2 26 05/03/2016   TSH 1.850 05/03/2016    Mm Digital Screening  Result Date: 07/16/2017 CLINICAL DATA:  Screening. Prior benign excisional biopsy of the left breast in 1986. EXAM: DIGITAL SCREENING BILATERAL MAMMOGRAM WITH CAD COMPARISON:  Previous exam(s). ACR Breast Density Category b: There are scattered areas of fibroglandular density. FINDINGS: There are no findings suspicious for malignancy. Images were processed with CAD. IMPRESSION: No mammographic evidence of malignancy. A result letter of this screening mammogram will be mailed directly to the patient. RECOMMENDATION: Screening mammogram in one year. (Code:SM-B-01Y) BI-RADS CATEGORY  2: Benign. Electronically Signed   By: Evangeline Dakin M.D.   On: 07/16/2017 12:55       Assessment & Plan:   Problem List Items Addressed This Visit    None    Visit Diagnoses    Dysuria    -  Primary   urine dip and symptoms c/w uti.  treat with cipro as directed.  stay hydrated.  follow.  probiotic as directed.  follow.    Relevant Orders   POCT urinalysis dipstick (Completed)   Urine Culture (Completed)   Urine Microscopic (Completed)        Einar Pheasant, MD

## 2017-09-05 NOTE — Patient Instructions (Signed)
Take probiotic daily while you are on the antibiotics and for two weeks after completing the antibiotic.

## 2017-09-05 NOTE — Telephone Encounter (Signed)
See if she can come in at 12:15 - for work in for uti.

## 2017-09-05 NOTE — Telephone Encounter (Signed)
Pt scheduled  

## 2017-09-07 ENCOUNTER — Encounter: Payer: Self-pay | Admitting: Internal Medicine

## 2017-09-07 LAB — URINE CULTURE
MICRO NUMBER:: 90512692
SPECIMEN QUALITY:: ADEQUATE

## 2017-09-08 ENCOUNTER — Other Ambulatory Visit: Payer: Self-pay | Admitting: Internal Medicine

## 2017-09-08 DIAGNOSIS — N390 Urinary tract infection, site not specified: Secondary | ICD-10-CM

## 2017-09-08 DIAGNOSIS — R319 Hematuria, unspecified: Secondary | ICD-10-CM

## 2017-09-08 NOTE — Progress Notes (Signed)
Order placed for urology referral and for f/u urinalysis

## 2017-10-17 ENCOUNTER — Encounter: Payer: Self-pay | Admitting: Internal Medicine

## 2017-10-17 ENCOUNTER — Ambulatory Visit: Payer: Managed Care, Other (non HMO) | Admitting: Internal Medicine

## 2017-10-17 VITALS — BP 110/70 | HR 70 | Temp 98.4°F | Resp 18 | Wt 161.8 lb

## 2017-10-17 DIAGNOSIS — F439 Reaction to severe stress, unspecified: Secondary | ICD-10-CM | POA: Diagnosis not present

## 2017-10-17 DIAGNOSIS — E7800 Pure hypercholesterolemia, unspecified: Secondary | ICD-10-CM | POA: Diagnosis not present

## 2017-10-17 DIAGNOSIS — E559 Vitamin D deficiency, unspecified: Secondary | ICD-10-CM

## 2017-10-17 DIAGNOSIS — R5383 Other fatigue: Secondary | ICD-10-CM | POA: Diagnosis not present

## 2017-10-17 DIAGNOSIS — N390 Urinary tract infection, site not specified: Secondary | ICD-10-CM | POA: Diagnosis not present

## 2017-10-17 DIAGNOSIS — K219 Gastro-esophageal reflux disease without esophagitis: Secondary | ICD-10-CM

## 2017-10-17 MED ORDER — OMEPRAZOLE 20 MG PO CPDR: 20.0000 mg | DELAYED_RELEASE_CAPSULE | Freq: Every day | ORAL | 3 refills | Status: DC

## 2017-10-17 NOTE — Patient Instructions (Signed)
Abdominal ultrasound - R11.0 (diagnosis code)

## 2017-10-17 NOTE — Progress Notes (Signed)
Patient ID: Meghan Valdez, female   DOB: [birth year], 57 y.o.   MRN: [id number]   Subjective:    Patient ID: Meghan L Osinski, female    DOB: [birth year], 57 y.o.   MRN: [id number]  HPI  Patient here for a scheduled follow up.  She reports she is doing relatively well.  Trying to stay active.  No chest pain.  No sob.  Does report some intermittent nausea.  States may occur one time per week.  May last 30 minutes and then resolves.  Eating.  Some acid reflux.  Bowels stable.  No abdominal pain.  No urine change.  No recent infections.  Still with increased stress.  Discussed with her today.  She does not feel any further intervention warranted.  Feels she is handling things relatively well.  Some fatigue, but overall stable.  Recommended full thyroid function tests.     Past Medical History:  Diagnosis Date  . Allergic rhinitis   . Basal cell carcinoma of skin    removed 1998  . Chicken pox   . Chronic sinusitis   . History of migraine headaches   . Hypercholesterolemia   . Migraines    Past Surgical History:  Procedure Laterality Date  . BREAST EXCISIONAL BIOPSY Left 1986  . CESAREAN SECTION  1992  . LAPAROSCOPIC CHOLECYSTECTOMY  2008  . LIPOSUCTION    . SEPTOPLASTY  1993  . SKIN CANCER EXCISION  1998   basal cell carcinoma of the scalp  . TOTAL ABDOMINAL HYSTERECTOMY W/ BILATERAL SALPINGOOPHORECTOMY     lysis of adhesions   Family History  Problem Relation Age of Onset  . Rheumatic fever Father   . Hypertension Mother   . Hyperlipidemia Mother   . Diabetes Mother   . Hypertension Maternal Grandmother   . Diabetes Brother   . Breast cancer Maternal Aunt        great  . Breast cancer Paternal Aunt        great  . Colon cancer Neg Hx    Social History   Socioeconomic History  . Marital status: Married    Spouse name: Not on file  . Number of children: 2  . Years of education: Not on file  . Highest education level: Not on file  Occupational History  .  Occupation: retired - med Youth worker  . Financial resource strain: Not on file  . Food insecurity:    Worry: Not on file    Inability: Not on file  . Transportation needs:    Medical: Not on file    Non-medical: Not on file  Tobacco Use  . Smoking status: Never Smoker  . Smokeless tobacco: Never Used  Substance and Sexual Activity  . Alcohol use: Yes    Alcohol/week: 0.0 oz  . Drug use: No  . Sexual activity: Not on file  Lifestyle  . Physical activity:    Days per week: Not on file    Minutes per session: Not on file  . Stress: Not on file  Relationships  . Social connections:    Talks on phone: Not on file    Gets together: Not on file    Attends religious service: Not on file    Active member of club or organization: Not on file    Attends meetings of clubs or organizations: Not on file    Relationship status: Not on file  Other Topics Concern  . Not on file  Social History Narrative  .  Not on file    Outpatient Encounter Medications as of 10/17/2017  Medication Sig  . ibuprofen (ADVIL,MOTRIN) 600 MG tablet Take 600 mg by mouth every 6 (six) hours as needed.  . Simethicone (GAS-X EXTRA STRENGTH) 125 MG CAPS Take by mouth as needed.  Marland Kitchen omeprazole (PRILOSEC) 20 MG capsule Take 1 capsule (20 mg total) by mouth daily.  . [DISCONTINUED] ciprofloxacin (CIPRO) 500 MG tablet Take 1 tablet (500 mg total) by mouth 2 (two) times daily.   No facility-administered encounter medications on file as of 10/17/2017.     Review of Systems  Constitutional: Negative for appetite change and unexpected weight change.  HENT: Negative for congestion and sinus pressure.   Respiratory: Negative for cough, chest tightness and shortness of breath.   Cardiovascular: Negative for chest pain, palpitations and leg swelling.  Gastrointestinal: Positive for nausea. Negative for abdominal pain, diarrhea and vomiting.  Genitourinary: Negative for difficulty urinating and dysuria.    Musculoskeletal: Negative for joint swelling and myalgias.  Skin: Negative for color change and rash.  Neurological: Negative for dizziness, light-headedness and headaches.  Psychiatric/Behavioral: Negative for agitation and dysphoric mood.       Objective:    Physical Exam  Constitutional: She appears well-developed and well-nourished. No distress.  HENT:  Nose: Nose normal.  Mouth/Throat: Oropharynx is clear and moist.  Neck: Neck supple. No thyromegaly present.  Cardiovascular: Normal rate and regular rhythm.  Pulmonary/Chest: Breath sounds normal. No respiratory distress. She has no wheezes.  Abdominal: Soft. Bowel sounds are normal. There is no tenderness.  Musculoskeletal: She exhibits no edema or tenderness.  Lymphadenopathy:    She has no cervical adenopathy.  Skin: No rash noted. No erythema.  Psychiatric: She has a normal mood and affect. Her behavior is normal.    BP 110/70 (BP Location: Left Arm, Patient Position: Sitting, Cuff Size: Normal)   Pulse 70   Temp 98.4 F (36.9 C) (Oral)   Resp 18   Wt 161 lb 12.8 oz (73.4 kg)   SpO2 98%   BMI 31.56 kg/m  Wt Readings from Last 3 Encounters:  10/17/17 161 lb 12.8 oz (73.4 kg)  09/05/17 158 lb 12.8 oz (72 kg)  06/03/17 158 lb (71.7 kg)     Lab Results  Component Value Date   WBC 5.3 05/03/2016   HGB 12.4 05/03/2016   HCT 37.9 05/03/2016   PLT 207 05/03/2016   GLUCOSE 90 05/03/2016   CHOL 213 (H) 05/03/2016   TRIG 94 05/03/2016   HDL 68 05/03/2016   LDLDIRECT 158.1 03/18/2013   LDLCALC 126 (H) 05/03/2016   ALT 14 05/03/2016   AST 17 05/03/2016   NA 144 05/03/2016   K 4.8 05/03/2016   CL 104 05/03/2016   CREATININE 0.69 05/03/2016   BUN 14 05/03/2016   CO2 26 05/03/2016   TSH 1.850 05/03/2016    Mm Digital Screening  Result Date: 07/16/2017 CLINICAL DATA:  Screening. Prior benign excisional biopsy of the left breast in 1986. EXAM: DIGITAL SCREENING BILATERAL MAMMOGRAM WITH CAD COMPARISON:   Previous exam(s). ACR Breast Density Category b: There are scattered areas of fibroglandular density. FINDINGS: There are no findings suspicious for malignancy. Images were processed with CAD. IMPRESSION: No mammographic evidence of malignancy. A result letter of this screening mammogram will be mailed directly to the patient. RECOMMENDATION: Screening mammogram in one year. (Code:SM-B-01Y) BI-RADS CATEGORY  2: Benign. Electronically Signed   By: Evangeline Dakin M.D.   On: 07/16/2017 12:55  Assessment & Plan:   Problem List Items Addressed This Visit    Frequent UTI    Seeing urology.  No recent problems.        GERD (gastroesophageal reflux disease)    Has been off prilosec.  Some intermittent nausea.  Also with some acid reflux.  Restart.  Discussed further evaluation.  She wants to start with prilosec first.  Discussed abdominal ultrasound.  She wants to check with insurance.  Will notify me if ok to schedule.  Follow.  Get her back in soon to reassess.        Relevant Medications   omeprazole (PRILOSEC) 20 MG capsule   Other Relevant Orders   Hepatic function panel   Amylase   Lipase   Hypercholesterolemia    Declines cholesterol medication.  Low cholesterol diet and exercise.  Follow lipid panel.        Relevant Orders   CBC with Differential/Platelet   Lipid panel   TSH   Basic metabolic panel   Stress    Increased stress.  Discussed with her today. She desires no further intervention.  Follow.        Vitamin D deficiency    Follow vitamin D level.        Relevant Orders   VITAMIN D 25 Hydroxy (Vit-D Deficiency, Fractures)    Other Visit Diagnoses    Other fatigue    -  Primary   Relevant Orders   T4, free   T3, free   Thyroglobulin Level       Einar Pheasant, MD

## 2017-10-21 ENCOUNTER — Encounter: Payer: Self-pay | Admitting: Internal Medicine

## 2017-10-21 NOTE — Assessment & Plan Note (Signed)
Increased stress.  Discussed with her today.  She desires no further intervention.  Follow.   

## 2017-10-21 NOTE — Assessment & Plan Note (Signed)
Follow vitamin D level.  

## 2017-10-21 NOTE — Assessment & Plan Note (Signed)
Declines cholesterol medication.  Low cholesterol diet and exercise.  Follow lipid panel.   

## 2017-10-21 NOTE — Assessment & Plan Note (Signed)
Has been off prilosec.  Some intermittent nausea.  Also with some acid reflux.  Restart.  Discussed further evaluation.  She wants to start with prilosec first.  Discussed abdominal ultrasound.  She wants to check with insurance.  Will notify me if ok to schedule.  Follow.  Get her back in soon to reassess.

## 2017-10-21 NOTE — Assessment & Plan Note (Signed)
Seeing urology.  No recent problems.

## 2017-11-27 ENCOUNTER — Encounter: Payer: Self-pay | Admitting: Internal Medicine

## 2017-11-27 NOTE — Telephone Encounter (Signed)
Please call and notify pt that I am not in the office this week.  If symptoms, will need to be seen.  I do not mind working her in next week, but I don't think she needs to wait this long.
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
## 2017-11-27 NOTE — Telephone Encounter (Signed)
LMTCB

## 2017-11-28 NOTE — Telephone Encounter (Signed)
LMTCB

## 2017-11-28 NOTE — Telephone Encounter (Signed)
Please confirm pt doing ok.  If persistent problems, needs to be evaluated - pending symptoms acute visit at our office, urgent care, etc.

## 2017-12-02 LAB — CBC WITH DIFFERENTIAL/PLATELET
BASOS ABS: 0 10*3/uL (ref 0.0–0.2)
Basos: 0 %
EOS (ABSOLUTE): 0.2 10*3/uL (ref 0.0–0.4)
EOS: 2 %
HEMATOCRIT: 37.2 % (ref 34.0–46.6)
Hemoglobin: 12.4 g/dL (ref 11.1–15.9)
Immature Grans (Abs): 0 10*3/uL (ref 0.0–0.1)
Immature Granulocytes: 0 %
LYMPHS ABS: 1.7 10*3/uL (ref 0.7–3.1)
Lymphs: 24 %
MCH: 29.8 pg (ref 26.6–33.0)
MCHC: 33.3 g/dL (ref 31.5–35.7)
MCV: 89 fL (ref 79–97)
MONOS ABS: 0.4 10*3/uL (ref 0.1–0.9)
Monocytes: 6 %
Neutrophils Absolute: 4.9 10*3/uL (ref 1.4–7.0)
Neutrophils: 68 %
Platelets: 239 10*3/uL (ref 150–450)
RBC: 4.16 x10E6/uL (ref 3.77–5.28)
RDW: 12.7 % (ref 12.3–15.4)
WBC: 7.2 10*3/uL (ref 3.4–10.8)

## 2017-12-02 LAB — BASIC METABOLIC PANEL
BUN / CREAT RATIO: 18 (ref 9–23)
BUN: 14 mg/dL (ref 6–24)
CHLORIDE: 101 mmol/L (ref 96–106)
CO2: 27 mmol/L (ref 20–29)
CREATININE: 0.78 mg/dL (ref 0.57–1.00)
Calcium: 10.1 mg/dL (ref 8.7–10.2)
GFR calc Af Amer: 98 mL/min/{1.73_m2} (ref >59)
GFR calc non Af Amer: 85 mL/min/{1.73_m2} (ref >59)
GLUCOSE: 83 mg/dL (ref 65–99)
Potassium: 4.6 mmol/L (ref 3.5–5.2)
SODIUM: 142 mmol/L (ref 134–144)

## 2017-12-02 LAB — LIPASE: Lipase: 32 U/L (ref 14–72)

## 2017-12-02 LAB — LIPID PANEL
CHOL/HDL RATIO: 3.5 ratio (ref 0.0–4.4)
Cholesterol, Total: 226 mg/dL — ABNORMAL HIGH (ref 100–199)
HDL: 64 mg/dL (ref >39)
LDL CALC: 132 mg/dL — AB (ref 0–99)
TRIGLYCERIDES: 152 mg/dL — AB (ref 0–149)
VLDL Cholesterol Cal: 30 mg/dL (ref 5–40)

## 2017-12-02 LAB — HEPATIC FUNCTION PANEL
ALT: 15 IU/L (ref 0–32)
AST: 16 IU/L (ref 0–40)
Albumin: 4.4 g/dL (ref 3.5–5.5)
Alkaline Phosphatase: 100 IU/L (ref 39–117)
Bilirubin Total: 0.4 mg/dL (ref 0.0–1.2)
Bilirubin, Direct: 0.11 mg/dL (ref 0.00–0.40)
Total Protein: 6.8 g/dL (ref 6.0–8.5)

## 2017-12-02 LAB — VITAMIN D 25 HYDROXY (VIT D DEFICIENCY, FRACTURES): VIT D 25 HYDROXY: 31.5 ng/mL (ref 30.0–100.0)

## 2017-12-02 LAB — T4, FREE: FREE T4: 1.21 ng/dL (ref 0.82–1.77)

## 2017-12-02 LAB — AMYLASE: Amylase: 43 U/L (ref 31–124)

## 2017-12-02 LAB — THYROGLOBULIN LEVEL: THYROGLOBULIN (TG-RIA): 31 ng/mL

## 2017-12-02 LAB — TSH: TSH: 1.97 u[IU]/mL (ref 0.450–4.500)

## 2017-12-02 LAB — T3, FREE: T3, Free: 2.7 pg/mL (ref 2.0–4.4)

## 2017-12-03 NOTE — Telephone Encounter (Signed)
Pt returned Trisha's call.  Please call pt: [phone]

## 2017-12-04 ENCOUNTER — Encounter: Payer: Self-pay | Admitting: Internal Medicine

## 2017-12-05 ENCOUNTER — Ambulatory Visit: Payer: Managed Care, Other (non HMO) | Admitting: Internal Medicine

## 2017-12-05 ENCOUNTER — Encounter: Payer: Self-pay | Admitting: Internal Medicine

## 2017-12-05 VITALS — BP 136/78 | HR 67 | Temp 98.1°F | Resp 18 | Wt 157.6 lb

## 2017-12-05 DIAGNOSIS — N3000 Acute cystitis without hematuria: Secondary | ICD-10-CM

## 2017-12-05 DIAGNOSIS — R300 Dysuria: Secondary | ICD-10-CM | POA: Diagnosis not present

## 2017-12-05 DIAGNOSIS — N390 Urinary tract infection, site not specified: Secondary | ICD-10-CM | POA: Diagnosis not present

## 2017-12-05 LAB — POCT URINALYSIS DIP (MANUAL ENTRY)
BILIRUBIN UA: NEGATIVE
Glucose, UA: NEGATIVE mg/dL
Ketones, POC UA: NEGATIVE mg/dL
NITRITE UA: NEGATIVE
PH UA: 6.5 (ref 5.0–8.0)
PROTEIN UA: NEGATIVE mg/dL
Spec Grav, UA: <=1.005 — AB (ref 1.010–1.025)
UROBILINOGEN UA: 0.2 U/dL

## 2017-12-05 LAB — URINALYSIS, MICROSCOPIC ONLY

## 2017-12-05 MED ORDER — CIPROFLOXACIN HCL 500 MG PO TABS: 500.0000 mg | ORAL_TABLET | Freq: Two times a day (BID) | ORAL | 0 refills | Status: DC

## 2017-12-05 NOTE — Progress Notes (Signed)
Patient ID: Meghan Valdez, female   DOB: 04-08-1961, 57 y.o.   MRN: [id number]   Subjective:    Patient ID: Meghan L Seedorf, female    DOB: [birth year], 57 y.o.   MRN: [id number]  HPI  Patient here as a work in with concerns regarding a possible urinary tract infection.  States symptoms started last week.  First noticed urinary odor.  Increased frequency and urgency.  No back pain.  No abdominal pain.  Eating.  No nausea or vomiting.  No fever.  No vaginal symptoms.  Discussed recurring uti's and referral back to urology.     Past Medical History:  Diagnosis Date  . Allergic rhinitis   . Basal cell carcinoma of skin    removed 1998  . Chicken pox   . Chronic sinusitis   . History of migraine headaches   . Hypercholesterolemia   . Migraines    Past Surgical History:  Procedure Laterality Date  . BREAST EXCISIONAL BIOPSY Left 1986  . CESAREAN SECTION  1992  . LAPAROSCOPIC CHOLECYSTECTOMY  2008  . LIPOSUCTION    . SEPTOPLASTY  1993  . SKIN CANCER EXCISION  1998   basal cell carcinoma of the scalp  . TOTAL ABDOMINAL HYSTERECTOMY W/ BILATERAL SALPINGOOPHORECTOMY     lysis of adhesions   Family History  Problem Relation Age of Onset  . Rheumatic fever Father   . Hypertension Mother   . Hyperlipidemia Mother   . Diabetes Mother   . Hypertension Maternal Grandmother   . Diabetes Brother   . Breast cancer Maternal Aunt        great  . Breast cancer Paternal Aunt        great  . Colon cancer Neg Hx    Social History   Socioeconomic History  . Marital status: Married    Spouse name: Not on file  . Number of children: 2  . Years of education: Not on file  . Highest education level: Not on file  Occupational History  . Occupation: retired - med Youth worker  . Financial resource strain: Not on file  . Food insecurity:    Worry: Not on file    Inability: Not on file  . Transportation needs:    Medical: Not on file    Non-medical: Not on file  Tobacco Use    . Smoking status: Never Smoker  . Smokeless tobacco: Never Used  Substance and Sexual Activity  . Alcohol use: Yes    Alcohol/week: 0.0 oz  . Drug use: No  . Sexual activity: Not on file  Lifestyle  . Physical activity:    Days per week: Not on file    Minutes per session: Not on file  . Stress: Not on file  Relationships  . Social connections:    Talks on phone: Not on file    Gets together: Not on file    Attends religious service: Not on file    Active member of club or organization: Not on file    Attends meetings of clubs or organizations: Not on file    Relationship status: Not on file  Other Topics Concern  . Not on file  Social History Narrative  . Not on file    Outpatient Encounter Medications as of 12/05/2017  Medication Sig  . ibuprofen (ADVIL,MOTRIN) 600 MG tablet Take 600 mg by mouth every 6 (six) hours as needed.  Marland Kitchen omeprazole (PRILOSEC) 20 MG capsule Take 1 capsule (20 mg total)  by mouth daily.  . Simethicone (GAS-X EXTRA STRENGTH) 125 MG CAPS Take by mouth as needed.  . ciprofloxacin (CIPRO) 500 MG tablet Take 1 tablet (500 mg total) by mouth 2 (two) times daily.   No facility-administered encounter medications on file as of 12/05/2017.     Review of Systems  Constitutional: Negative for appetite change and fever.  Cardiovascular: Negative for leg swelling.  Gastrointestinal: Negative for abdominal pain and nausea.  Genitourinary: Positive for dysuria, frequency and urgency. Negative for vaginal discharge.  Musculoskeletal: Negative for back pain.  Skin: Negative for rash.  Psychiatric/Behavioral: Negative for agitation and dysphoric mood.       Objective:    Physical Exam  Constitutional: She appears well-developed and well-nourished. No distress.  Cardiovascular: Normal rate and regular rhythm.  Pulmonary/Chest: Breath sounds normal. No respiratory distress. She has no wheezes.  Abdominal: Soft. Bowel sounds are normal. There is no tenderness.   Musculoskeletal:  No CVA tenderness.  Non tender.   Psychiatric: She has a normal mood and affect. Her behavior is normal.    BP 136/78 (BP Location: Left Arm, Patient Position: Sitting, Cuff Size: Normal)   Pulse 67   Temp 98.1 F (36.7 C) (Oral)   Resp 18   Wt 157 lb 9.6 oz (71.5 kg)   SpO2 97%   BMI 30.74 kg/m  Wt Readings from Last 3 Encounters:  12/05/17 157 lb 9.6 oz (71.5 kg)  10/17/17 161 lb 12.8 oz (73.4 kg)  09/05/17 158 lb 12.8 oz (72 kg)     Lab Results  Component Value Date   WBC 7.2 11/25/2017   HGB 12.4 11/25/2017   HCT 37.2 11/25/2017   PLT 239 11/25/2017   GLUCOSE 83 11/25/2017   CHOL 226 (H) 11/25/2017   TRIG 152 (H) 11/25/2017   HDL 64 11/25/2017   LDLDIRECT 158.1 03/18/2013   LDLCALC 132 (H) 11/25/2017   ALT 15 11/25/2017   AST 16 11/25/2017   NA 142 11/25/2017   K 4.6 11/25/2017   CL 101 11/25/2017   CREATININE 0.78 11/25/2017   BUN 14 11/25/2017   CO2 27 11/25/2017   TSH 1.970 11/25/2017       Assessment & Plan:   Problem List Items Addressed This Visit    Frequent UTI    Has seen urology previously.  Discussed referral back. She wants to hold on referral.        UTI (urinary tract infection)    Symptoms and urine dip c/w uti.  Send for culture.  Treat with cipro. Stay hydrated.  Follow.  Recheck urine and culture after complete abx to confirm clears.         Other Visit Diagnoses    Dysuria    -  Primary   Relevant Orders   POCT urinalysis dipstick (Completed)   Urine Microscopic Only (Completed)   Urine Culture (Completed)       Einar Pheasant, MD

## 2017-12-07 ENCOUNTER — Other Ambulatory Visit: Payer: Self-pay | Admitting: Internal Medicine

## 2017-12-07 ENCOUNTER — Encounter: Payer: Self-pay | Admitting: Internal Medicine

## 2017-12-07 DIAGNOSIS — N390 Urinary tract infection, site not specified: Secondary | ICD-10-CM

## 2017-12-07 LAB — URINE CULTURE
MICRO NUMBER: 90886674
SPECIMEN QUALITY: ADEQUATE

## 2017-12-07 NOTE — Assessment & Plan Note (Signed)
Symptoms and urine dip c/w uti.  Send for culture.  Treat with cipro. Stay hydrated.  Follow.  Recheck urine and culture after complete abx to confirm clears.

## 2017-12-07 NOTE — Progress Notes (Signed)
Order placed for f/u urinalysis and culture.

## 2017-12-07 NOTE — Assessment & Plan Note (Signed)
Has seen urology previously.  Discussed referral back. She wants to hold on referral.

## 2017-12-18 ENCOUNTER — Other Ambulatory Visit (INDEPENDENT_AMBULATORY_CARE_PROVIDER_SITE_OTHER): Payer: Managed Care, Other (non HMO)

## 2017-12-18 DIAGNOSIS — N390 Urinary tract infection, site not specified: Secondary | ICD-10-CM

## 2017-12-19 LAB — URINALYSIS, ROUTINE W REFLEX MICROSCOPIC
Bilirubin Urine: NEGATIVE
Hgb urine dipstick: NEGATIVE
KETONES UR: NEGATIVE
Leukocytes, UA: NEGATIVE
Nitrite: NEGATIVE
PH: 7 (ref 5.0–8.0)
RBC / HPF: NONE SEEN (ref 0–?)
TOTAL PROTEIN, URINE-UPE24: NEGATIVE
URINE GLUCOSE: NEGATIVE
UROBILINOGEN UA: 0.2 (ref 0.0–1.0)

## 2017-12-19 LAB — URINE CULTURE
MICRO NUMBER:: 90940401
SPECIMEN QUALITY:: ADEQUATE

## 2017-12-21 ENCOUNTER — Encounter: Payer: Self-pay | Admitting: Internal Medicine

## 2018-01-23 ENCOUNTER — Ambulatory Visit: Payer: Managed Care, Other (non HMO) | Admitting: Internal Medicine

## 2018-04-21 ENCOUNTER — Encounter: Payer: Self-pay | Admitting: Internal Medicine

## 2018-04-21 ENCOUNTER — Ambulatory Visit (INDEPENDENT_AMBULATORY_CARE_PROVIDER_SITE_OTHER): Payer: Managed Care, Other (non HMO) | Admitting: Internal Medicine

## 2018-04-21 VITALS — BP 130/86 | HR 74 | Temp 97.9°F | Resp 14 | Ht 60.0 in | Wt 159.2 lb

## 2018-04-21 DIAGNOSIS — E7800 Pure hypercholesterolemia, unspecified: Secondary | ICD-10-CM

## 2018-04-21 DIAGNOSIS — F439 Reaction to severe stress, unspecified: Secondary | ICD-10-CM | POA: Diagnosis not present

## 2018-04-21 DIAGNOSIS — E559 Vitamin D deficiency, unspecified: Secondary | ICD-10-CM

## 2018-04-21 DIAGNOSIS — N390 Urinary tract infection, site not specified: Secondary | ICD-10-CM | POA: Diagnosis not present

## 2018-04-21 DIAGNOSIS — Z0000 Encounter for general adult medical examination without abnormal findings: Secondary | ICD-10-CM

## 2018-04-21 NOTE — Progress Notes (Signed)
Patient ID: Meghan Valdez, female   DOB: May 09, 1961, 57 y.o.   MRN: [id number]   Subjective:    Patient ID: Meghan Valdez, female    DOB: [birth year], 57 y.o.   MRN: [id number]  HPI  Patient here for her physical exam.  She reports she is doing well.  Had another uti 3 weeks ago.  She reports she was treated with macrobid.  Has had recurring urinary tract infections.  Discussed with her regarding referral back to urology given persistent recurring infections.  Has seen Dr Jacqlyn Larsen previously.  No symptoms now.  Also reports tries to stay active.  No chest pain.  No sob.  No acid reflux. No abdominal pain.  Bowels moving.  Handling stress.  Work is better.  Discussed family history of colon polyps.  Discussed her last colonoscopy.  States was told due 10 years after last.  Discussed my desire for referral back to GI given family history.  She declined.  Agreed to stool cards.  Discussed cholesterol.  She declines cholesterol medication.     Past Medical History:  Diagnosis Date  . Allergic rhinitis   . Basal cell carcinoma of skin    removed 1998  . Chicken pox   . Chronic sinusitis   . History of migraine headaches   . Hypercholesterolemia   . Migraines    Past Surgical History:  Procedure Laterality Date  . BREAST EXCISIONAL BIOPSY Left 1986  . CESAREAN SECTION  1992  . LAPAROSCOPIC CHOLECYSTECTOMY  2008  . LIPOSUCTION    . SEPTOPLASTY  1993  . SKIN CANCER EXCISION  1998   basal cell carcinoma of the scalp  . TOTAL ABDOMINAL HYSTERECTOMY W/ BILATERAL SALPINGOOPHORECTOMY     lysis of adhesions   Family History  Problem Relation Age of Onset  . Rheumatic fever Father   . Hypertension Mother   . Hyperlipidemia Mother   . Diabetes Mother   . Hypertension Maternal Grandmother   . Diabetes Brother   . Breast cancer Maternal Aunt        great  . Breast cancer Paternal Aunt        great  . Colon cancer Neg Hx    Social History   Socioeconomic History  . Marital status:  Divorced    Spouse name: Not on file  . Number of children: 2  . Years of education: Not on file  . Highest education level: Not on file  Occupational History  . Occupation: retired - med Youth worker  . Financial resource strain: Not on file  . Food insecurity:    Worry: Not on file    Inability: Not on file  . Transportation needs:    Medical: Not on file    Non-medical: Not on file  Tobacco Use  . Smoking status: Never Smoker  . Smokeless tobacco: Never Used  Substance and Sexual Activity  . Alcohol use: Yes    Alcohol/week: 0.0 standard drinks  . Drug use: No  . Sexual activity: Not on file  Lifestyle  . Physical activity:    Days per week: Not on file    Minutes per session: Not on file  . Stress: Not on file  Relationships  . Social connections:    Talks on phone: Not on file    Gets together: Not on file    Attends religious service: Not on file    Active member of club or organization: Not on file    Attends  meetings of clubs or organizations: Not on file    Relationship status: Not on file  Other Topics Concern  . Not on file  Social History Narrative  . Not on file    Outpatient Encounter Medications as of 04/21/2018  Medication Sig  . ibuprofen (ADVIL,MOTRIN) 600 MG tablet Take 600 mg by mouth every 6 (six) hours as needed.  . Simethicone (GAS-X EXTRA STRENGTH) 125 MG CAPS Take by mouth as needed.  . [DISCONTINUED] ciprofloxacin (CIPRO) 500 MG tablet Take 1 tablet (500 mg total) by mouth 2 (two) times daily. (Patient not taking: Reported on 04/21/2018)  . [DISCONTINUED] omeprazole (PRILOSEC) 20 MG capsule Take 1 capsule (20 mg total) by mouth daily. (Patient not taking: Reported on 04/21/2018)   No facility-administered encounter medications on file as of 04/21/2018.     Review of Systems  Constitutional: Negative for appetite change and unexpected weight change.  HENT: Negative for congestion and sinus pressure.   Eyes: Negative for pain and  visual disturbance.  Respiratory: Negative for cough, chest tightness and shortness of breath.   Cardiovascular: Negative for chest pain, palpitations and leg swelling.  Gastrointestinal: Negative for abdominal pain, diarrhea, nausea and vomiting.  Genitourinary: Negative for difficulty urinating and dysuria.  Musculoskeletal: Negative for joint swelling and myalgias.  Skin: Negative for color change and rash.  Neurological: Negative for dizziness, light-headedness and headaches.  Hematological: Negative for adenopathy. Does not bruise/bleed easily.  Psychiatric/Behavioral: Negative for agitation and dysphoric mood.       Objective:    Physical Exam  Constitutional: She is oriented to person, place, and time. She appears well-developed and well-nourished. No distress.  HENT:  Nose: Nose normal.  Mouth/Throat: Oropharynx is clear and moist.  Eyes: Right eye exhibits no discharge. Left eye exhibits no discharge. No scleral icterus.  Neck: Neck supple. No thyromegaly present.  Cardiovascular: Normal rate and regular rhythm.  Pulmonary/Chest: Breath sounds normal. No accessory muscle usage. No tachypnea. No respiratory distress. She has no decreased breath sounds. She has no wheezes. She has no rhonchi. Right breast exhibits no inverted nipple, no mass, no nipple discharge and no tenderness (no axillary adenopathy). Left breast exhibits no inverted nipple, no mass, no nipple discharge and no tenderness (no axilarry adenopathy).  Abdominal: Soft. Bowel sounds are normal. There is no tenderness.  Musculoskeletal: She exhibits no edema or tenderness.  Lymphadenopathy:    She has no cervical adenopathy.  Neurological: She is alert and oriented to person, place, and time.  Skin: No rash noted. No erythema.  Psychiatric: She has a normal mood and affect. Her behavior is normal.    BP 130/86 (BP Location: Left Arm, Patient Position: Sitting, Cuff Size: Normal)   Pulse 74   Temp 97.9 F (36.6  C) (Oral)   Resp 14   Ht 5' (1.524 m)   Wt 159 lb 3.2 oz (72.2 kg)   SpO2 98%   BMI 31.09 kg/m  Wt Readings from Last 3 Encounters:  04/21/18 159 lb 3.2 oz (72.2 kg)  12/05/17 157 lb 9.6 oz (71.5 kg)  10/17/17 161 lb 12.8 oz (73.4 kg)     Lab Results  Component Value Date   WBC 7.2 11/25/2017   HGB 12.4 11/25/2017   HCT 37.2 11/25/2017   PLT 239 11/25/2017   GLUCOSE 83 11/25/2017   CHOL 226 (H) 11/25/2017   TRIG 152 (H) 11/25/2017   HDL 64 11/25/2017   LDLDIRECT 158.1 03/18/2013   LDLCALC 132 (H) 11/25/2017  ALT 15 11/25/2017   AST 16 11/25/2017   NA 142 11/25/2017   K 4.6 11/25/2017   CL 101 11/25/2017   CREATININE 0.78 11/25/2017   BUN 14 11/25/2017   CO2 27 11/25/2017   TSH 1.970 11/25/2017    Mm Digital Screening  Result Date: 07/16/2017 CLINICAL DATA:  Screening. Prior benign excisional biopsy of the left breast in 1986. EXAM: DIGITAL SCREENING BILATERAL MAMMOGRAM WITH CAD COMPARISON:  Previous exam(s). ACR Breast Density Category b: There are scattered areas of fibroglandular density. FINDINGS: There are no findings suspicious for malignancy. Images were processed with CAD. IMPRESSION: No mammographic evidence of malignancy. A result letter of this screening mammogram will be mailed directly to the patient. RECOMMENDATION: Screening mammogram in one year. (Code:SM-B-01Y) BI-RADS CATEGORY  2: Benign. Electronically Signed   By: Evangeline Dakin M.D.   On: 07/16/2017 12:55       Assessment & Plan:   Problem List Items Addressed This Visit    Frequent UTI    Persistent recurring uti.  Last three weeks ago. Discussed with her today.  Agreeable for referral to urology.  Has seen Dr Jacqlyn Larsen previously.        Relevant Orders   Ambulatory referral to Urology   Health care maintenance    Physical today 04/21/18.  Colonoscopy 12/2011.  States not due for 10 years.  Has family history of colon polyps.  discussed referral back. She declines.  Stool cards given.  [facility]  07/16/17 - [facility].  Discussed need for f/u pap.  She declined. States will have at next visit.        Hypercholesterolemia    Declines cholesterol medication.  Discussed again with her today.  She continues to decline.  Low cholesterol diet and exercise.  Follow lipid panel.        Relevant Orders   Basic metabolic panel   Hepatic function panel   Lipid panel   Stress    Stress is better.  She feels she is doing well.  Follow.        Vitamin D deficiency    Follow vitamin D level.         Other Visit Diagnoses    Routine general medical examination at a health care facility    -  Primary       Einar Pheasant, MD

## 2018-04-21 NOTE — Assessment & Plan Note (Addendum)
Physical today 04/21/18.  Colonoscopy 12/2011.  States not due for 10 years.  Has family history of colon polyps.  discussed referral back. She declines.  Stool cards given.  [facility] 07/16/17 - [facility].  Discussed need for f/u pap.  She declined. States will have at next visit.

## 2018-04-22 ENCOUNTER — Encounter: Payer: Self-pay | Admitting: Internal Medicine

## 2018-04-22 NOTE — Assessment & Plan Note (Signed)
Persistent recurring uti.  Last three weeks ago. Discussed with her today.  Agreeable for referral to urology.  Has seen Dr Jacqlyn Larsen previously.

## 2018-04-22 NOTE — Assessment & Plan Note (Signed)
Follow vitamin D level.  

## 2018-04-22 NOTE — Assessment & Plan Note (Signed)
Stress is better.  She feels she is doing well.  Follow.

## 2018-04-22 NOTE — Assessment & Plan Note (Signed)
Declines cholesterol medication.  Discussed again with her today.  She continues to decline.  Low cholesterol diet and exercise.  Follow lipid panel.

## 2018-05-12 ENCOUNTER — Encounter: Payer: Self-pay | Admitting: Internal Medicine

## 2018-05-12 LAB — LIPID PANEL
CHOL/HDL RATIO: 3.4 ratio (ref 0.0–4.4)
Cholesterol, Total: 226 mg/dL — ABNORMAL HIGH (ref 100–199)
HDL: 67 mg/dL (ref >39)
LDL CALC: 139 mg/dL — AB (ref 0–99)
TRIGLYCERIDES: 98 mg/dL (ref 0–149)
VLDL Cholesterol Cal: 20 mg/dL (ref 5–40)

## 2018-05-12 LAB — BASIC METABOLIC PANEL
BUN/Creatinine Ratio: 18 (ref 9–23)
BUN: 14 mg/dL (ref 6–24)
CO2: 23 mmol/L (ref 20–29)
CREATININE: 0.78 mg/dL (ref 0.57–1.00)
Calcium: 9.8 mg/dL (ref 8.7–10.2)
Chloride: 102 mmol/L (ref 96–106)
GFR calc Af Amer: 97 mL/min/{1.73_m2} (ref >59)
GFR calc non Af Amer: 84 mL/min/{1.73_m2} (ref >59)
GLUCOSE: 82 mg/dL (ref 65–99)
Potassium: 4.8 mmol/L (ref 3.5–5.2)
SODIUM: 143 mmol/L (ref 134–144)

## 2018-05-12 LAB — HEPATIC FUNCTION PANEL
ALBUMIN: 4.6 g/dL (ref 3.5–5.5)
ALK PHOS: 96 IU/L (ref 39–117)
ALT: 16 IU/L (ref 0–32)
AST: 17 IU/L (ref 0–40)
Bilirubin Total: 0.6 mg/dL (ref 0.0–1.2)
Bilirubin, Direct: 0.14 mg/dL (ref 0.00–0.40)
TOTAL PROTEIN: 6.6 g/dL (ref 6.0–8.5)

## 2018-06-02 ENCOUNTER — Ambulatory Visit: Payer: Self-pay | Admitting: Urology

## 2018-06-03 ENCOUNTER — Encounter: Payer: Self-pay | Admitting: Urology

## 2018-06-03 ENCOUNTER — Ambulatory Visit: Payer: Managed Care, Other (non HMO) | Admitting: Urology

## 2018-06-03 DIAGNOSIS — R300 Dysuria: Secondary | ICD-10-CM | POA: Diagnosis not present

## 2018-06-03 DIAGNOSIS — N390 Urinary tract infection, site not specified: Secondary | ICD-10-CM

## 2018-06-03 LAB — BLADDER SCAN AMB NON-IMAGING: Scan Result: 1

## 2018-06-03 MED ORDER — CEPHALEXIN 500 MG PO CAPS: 500.0000 mg | ORAL_CAPSULE | Freq: Four times a day (QID) | ORAL | 0 refills | Status: DC

## 2018-06-03 MED ORDER — ESTROGENS, CONJUGATED 0.625 MG/GM VA CREA: 1.0000 | TOPICAL_CREAM | Freq: Every day | VAGINAL | 12 refills | Status: DC

## 2018-06-03 NOTE — Progress Notes (Addendum)
06/03/2018 11:35 AM   Meghan Valdez 1960-06-03 [id number]  Referring provider: Einar Pheasant, [address] [place], [place] [address]  Chief Complaint  Patient presents with  . Recurrent UTI    HPI: Meghan Valdez is a 58 yo F who presents today for the evaluation and management of frequent UTIs. He was referred to [place] by Einar Pheasant, MD.   Over the past year, she reports that she is had increased number of urinary tract infections.  This is been a lifelong issue but over the past year, she has had exceptionally more than baseline.  She reports about 5-6 subjective infections over the past year.  Today, she does report over the past several weeks, she has had symptoms of improving infection.  She reports dysuria, lower abdominal discomfort, urgency and frequency different from her baseline.  No fevers or chills.  No flank pain.  She does not feel like her last round of antibiotics completely cleared up her infection.  Three of the urine cultures in the past year was  Positive, each for E. coli.  In addition, she is to used telehealth to receive antibiotics, most recently Macrobid in December for urinary symptoms presumably secondary to UTI.  She was previously followed by Dr. Jacqlyn Larsen for a cysto and CT which were negative 2017.  Pt reports of doing nothing to prevent UTIs and she has been drinking more water. She reports of urgency, burning, and odor that has been present since Thanksgiving. She has tried cranberry tablets has has not taken them regularly. She was also prescribed vaginal estrogen cream by Dr. Jacqlyn Larsen but she discontinued its use due her divorce and associated financial issues. She takes probiotics during and after antibiotic treatment but not regularly.   She has no personal history of breast cancer but has had removal of breast. She does report of a hysterectomy and does not have ovaries. She has been on Menopause since 2004. She gets hot  flashes once in while and has denies constipation. She denies sexual activity.  She is unaware of any vaginal dryness.  PMH: Past Medical History:  Diagnosis Date  . Allergic rhinitis   . Basal cell carcinoma of skin    removed 1998  . Chicken pox   . Chronic sinusitis   . History of migraine headaches   . Hypercholesterolemia   . Migraines    Surgical History: Past Surgical History:  Procedure Laterality Date  . BREAST EXCISIONAL BIOPSY Left 1986  . CESAREAN SECTION  1992  . LAPAROSCOPIC CHOLECYSTECTOMY  2008  . LIPOSUCTION    . SEPTOPLASTY  1993  . SKIN CANCER EXCISION  1998   basal cell carcinoma of the scalp  . TOTAL ABDOMINAL HYSTERECTOMY W/ BILATERAL SALPINGOOPHORECTOMY     lysis of adhesions    Home Medications:  Allergies as of 06/03/2018      Reactions   Septra [sulfamethoxazole-trimethoprim]    Sulfa Antibiotics Hives      Medication List       Accurate as of June 03, 2018 11:59 PM. Always use your most recent med list.        cephALEXin 500 MG capsule Commonly known as:  KEFLEX Take 1 capsule (500 mg total) by mouth 4 (four) times daily.   conjugated estrogens vaginal cream Commonly known as:  PREMARIN Place 1 Applicatorful vaginally daily. Use pea sized amount M-W-Fr before bedtime   GAS-X EXTRA STRENGTH 125 MG Caps Generic drug:  Simethicone Take by mouth  as needed.   ibuprofen 600 MG tablet Commonly known as:  ADVIL,MOTRIN Take 600 mg by mouth every 6 (six) hours as needed.       Allergies:  Allergies  Allergen Reactions  . Septra [Sulfamethoxazole-Trimethoprim]   . Sulfa Antibiotics Hives    Family History: Family History  Problem Relation Age of Onset  . Rheumatic fever Father   . Hypertension Mother   . Hyperlipidemia Mother   . Diabetes Mother   . Hypertension Maternal Grandmother   . Diabetes Brother   . Breast cancer Maternal Aunt        great  . Breast cancer Paternal Aunt        great  . Colon cancer Neg Hx      Social History:  reports that she has never smoked. She has never used smokeless tobacco. She reports current alcohol use. She reports that she does not use drugs.  ROS: UROLOGY Frequent Urination?: Yes Hard to postpone urination?: No Burning/pain with urination?: Yes Get up at night to urinate?: Yes Leakage of urine?: Yes Urine stream starts and stops?: No Trouble starting stream?: No Do you have to strain to urinate?: No Blood in urine?: No Urinary tract infection?: Yes Sexually transmitted disease?: No Injury to kidneys or bladder?: No Painful intercourse?: No Weak stream?: No Currently pregnant?: No Vaginal bleeding?: No Last menstrual period?: n  Gastrointestinal Nausea?: No Vomiting?: No Indigestion/heartburn?: No Diarrhea?: No Constipation?: No  Constitutional Fever: No Night sweats?: No Weight loss?: No Fatigue?: No  Skin Skin rash/lesions?: No Itching?: No  Eyes Blurred vision?: No Double vision?: No  Ears/Nose/Throat Sore throat?: No Sinus problems?: No  Hematologic/Lymphatic Swollen glands?: No Easy bruising?: Yes  Cardiovascular Leg swelling?: No Chest pain?: No  Respiratory Cough?: No Shortness of breath?: No  Endocrine Excessive thirst?: No  Musculoskeletal Back pain?: No Joint pain?: No  Neurological Headaches?: No Dizziness?: No  Psychologic Depression?: No Anxiety?: No  Physical Exam: Vitals reviewed Constitutional:  Alert and oriented, No acute distress. HEENT: [place] AT, moist mucus membranes.  Trachea midline, no masses. Cardiovascular: No clubbing, cyanosis, or edema. Respiratory: Normal respiratory effort, no increased work of breathing. GU: No CVA tenderness Skin: No rashes, bruises or suspicious lesions. Neurologic: Grossly intact, no focal deficits, moving all 4 extremities. Psychiatric: Normal mood and affect.  Laboratory Data: Cr 0.78 04/2018  Urinalysis/ Imaging Results for orders placed or  performed in visit on 06/03/18  Microscopic Examination  Result Value Ref Range   WBC, UA 11-30 (A) 0 - 5 /hpf   RBC, UA None seen 0 - 2 /hpf   Epithelial Cells (non renal) 0-10 0 - 10 /hpf   Mucus, UA Present (A) Not Estab.   Bacteria, UA None seen None seen/Few  Urinalysis, Complete  Result Value Ref Range   Specific Gravity, UA 1.020 1.005 - 1.030   pH, UA 7.0 5.0 - 7.5   Color, UA Yellow Yellow   Appearance Ur Cloudy (A) Clear   Leukocytes, UA 2+ (A) Negative   Protein, UA Negative Negative/Trace   Glucose, UA Negative Negative   Ketones, UA Trace (A) Negative   RBC, UA Trace (A) Negative   Bilirubin, UA Negative Negative   Urobilinogen, Ur 0.2 0.2 - 1.0 mg/dL   Nitrite, UA Negative Negative   Microscopic Examination See below:   Bladder Scan (Post Void Residual) in office  Result Value Ref Range   Scan Result 1    Assessment & Plan:    1. rUTIs  New AUA guidelines for recurrent UTIs were discussed at length including rationale for antibiotic stewardship Discontinue cranberry juice; encouraged to take cranberry tablets bid Encouraged to continue the use of probiotics once a day  Rx of Vaginal estrogen cream per urethral meatus, Premarin, was given - to take MWF; samples given today   I have encouraged her to return to our office specifically for any signs or symptoms of UTI for same-day visit, will treat as needed and if we see an evolving pattern with persistent recurrent UTIs, may offer additional evaluation or consideration of suppression if above interventions fail She is agreeable this plan  2. Dysuria  UA today suspicious for infection in light of urinary symptoms Urine culture today - see likely acute cystitis; Rx of Keflex quid for 7 days F/u urine culture just as needed  All up as discussed above  [facility] [address], [place], [place] [address] [phone]  I, Lucas Mallow, am acting as a scribe for Dr.  Hollice Espy,  I have reviewed the above documentation for accuracy and completeness, and I agree with the above.   Hollice Espy, MD   I spent 45 min with this patient of which greater than 50% was spent in counseling and coordination of care with the patient.

## 2018-06-04 LAB — MICROSCOPIC EXAMINATION
Bacteria, UA: NONE SEEN
RBC, UA: NONE SEEN /hpf (ref 0–2)

## 2018-06-04 LAB — URINALYSIS, COMPLETE
BILIRUBIN UA: NEGATIVE
GLUCOSE, UA: NEGATIVE
Nitrite, UA: NEGATIVE
PROTEIN UA: NEGATIVE
SPEC GRAV UA: 1.02 (ref 1.005–1.030)
Urobilinogen, Ur: 0.2 mg/dL (ref 0.2–1.0)
pH, UA: 7 (ref 5.0–7.5)

## 2018-06-06 LAB — CULTURE, URINE COMPREHENSIVE

## 2018-06-08 ENCOUNTER — Telehealth: Payer: Self-pay | Admitting: Urology

## 2018-06-08 NOTE — Telephone Encounter (Signed)
Left message per DPR for patient to continue on Keflex as directed and to contact office with any questions

## 2018-06-08 NOTE — Telephone Encounter (Signed)
-----   Message from Hollice Espy, MD sent at 06/08/2018  7:49 AM EST ----- Keflex is appropriate.  Please complete this course.    Hollice Espy, MD

## 2018-06-08 NOTE — Telephone Encounter (Signed)
Left message for Pt. To continue the course of the antibiotic Keflex.

## 2018-06-25 ENCOUNTER — Ambulatory Visit: Payer: Managed Care, Other (non HMO) | Admitting: Family Medicine

## 2018-06-25 ENCOUNTER — Telehealth: Payer: Self-pay | Admitting: Urology

## 2018-06-25 DIAGNOSIS — R300 Dysuria: Secondary | ICD-10-CM

## 2018-06-25 DIAGNOSIS — N390 Urinary tract infection, site not specified: Secondary | ICD-10-CM

## 2018-06-25 LAB — URINALYSIS, COMPLETE
Bilirubin, UA: NEGATIVE
Glucose, UA: NEGATIVE
Ketones, UA: NEGATIVE
Nitrite, UA: NEGATIVE
Protein, UA: NEGATIVE
Specific Gravity, UA: 1.025 (ref 1.005–1.030)
Urobilinogen, Ur: 0.2 mg/dL (ref 0.2–1.0)
pH, UA: 5.5 (ref 5.0–7.5)

## 2018-06-25 LAB — MICROSCOPIC EXAMINATION

## 2018-06-25 MED ORDER — CEPHALEXIN 500 MG PO CAPS: 500.0000 mg | ORAL_CAPSULE | Freq: Four times a day (QID) | ORAL | 0 refills | Status: DC

## 2018-06-25 NOTE — Telephone Encounter (Signed)
Pt called office complaining of urgency, burning, not peeing much when she goes.  Pt says it's painful to sit down.  She was added at 11:30 today for a nurse visit.

## 2018-06-25 NOTE — Progress Notes (Signed)
Patient presents today with dysuria and urinary frequency. A urine was collected for UA, UCX. Patient states her symptoms began about 1 week ago, she has not had Urological surgeries. He has been on ABX in the 30 days for UTI. Dr. Erlene Quan review Ua and Keflex was sent to the pharmacy.

## 2018-06-29 LAB — CULTURE, URINE COMPREHENSIVE

## 2018-06-29 NOTE — Telephone Encounter (Signed)
Mychart sent.

## 2018-06-29 NOTE — Telephone Encounter (Signed)
-----   Message from Hollice Espy, MD sent at 06/29/2018  4:47 PM EST ----- Keflex works for the Klebsiella but she also grew enterococcus which may not be adequately treated.  If she still having symptoms, lets treat her with Cipro 500 mg twice daily for 5 days.  Hollice Espy, MD

## 2018-11-03 ENCOUNTER — Ambulatory Visit: Payer: Managed Care, Other (non HMO) | Admitting: Internal Medicine

## 2019-07-17 DIAGNOSIS — L300 Nummular dermatitis: Secondary | ICD-10-CM | POA: Insufficient documentation

## 2019-08-04 ENCOUNTER — Ambulatory Visit: Payer: Managed Care, Other (non HMO) | Admitting: Dermatology

## 2019-11-24 ENCOUNTER — Encounter: Payer: Self-pay | Admitting: Internal Medicine

## 2020-05-21 ENCOUNTER — Encounter: Payer: Self-pay | Admitting: Internal Medicine

## 2020-05-22 ENCOUNTER — Telehealth: Payer: Self-pay | Admitting: Internal Medicine

## 2020-05-22 ENCOUNTER — Other Ambulatory Visit: Payer: Self-pay

## 2020-05-22 ENCOUNTER — Telehealth (INDEPENDENT_AMBULATORY_CARE_PROVIDER_SITE_OTHER): Payer: Managed Care, Other (non HMO) | Admitting: Internal Medicine

## 2020-05-22 DIAGNOSIS — Z20822 Contact with and (suspected) exposure to covid-19: Secondary | ICD-10-CM | POA: Diagnosis not present

## 2020-05-22 DIAGNOSIS — R059 Cough, unspecified: Secondary | ICD-10-CM

## 2020-05-22 MED ORDER — ONDANSETRON 4 MG PO TBDP: 4.0000 mg | ORAL_TABLET | Freq: Two times a day (BID) | ORAL | 0 refills | Status: DC | PRN

## 2020-05-22 NOTE — Progress Notes (Addendum)
Virtual Visit via video Note  This visit type was conducted due to national recommendations for restrictions regarding the COVID-19 pandemic (e.g. social distancing).  This format is felt to be most appropriate for this patient at this time.  All issues noted in this document were discussed and addressed.  No physical exam was performed (except for noted visual exam findings with Video Visits).   I connected with [place] Denise by a video enabled telemedicine application and verified that I am speaking with the correct person using two identifiers. Location patient: home Location provider: work Persons participating in the virtual visit: patient, provider  The limitations, risks, security and privacy concerns of performing an evaluation and management service by video and the availability of in person appointments have been discussed.  It has also been discussed with the patient that there may be a patient responsible charge related to this service. The patient expressed understanding and agreed to proceed.   Reason for visit: work in appt  HPI: Work in with concerns regarding fever, body aches, cough and congestion.  Her son was diagnosed with covid 05/14/20.  Her symptoms began 05/14/20.  Started with headache and body aches.  Stable for a few days and then progressed.  Had a rapid and PCR test 05/16/20 - negative.  On 05/19/20, developed a cough and increased body aches.  Fever yesterday - 101.  Headache today.  Temp today - 100.  Taking ibuprofen 600mg  bid.  Cough is occasionally productive.  No chest congestion, chest pain or sob.  Some nausea.  Diarrhea - now one episode per day.  Decreased appetite.  Has not been vaccinated.  Daughter has symptoms as well.    ROS: See pertinent positives and negatives per HPI.  Past Medical History:  Diagnosis Date  . Allergic rhinitis   . Basal cell carcinoma of skin    removed 1998  . Chicken pox   . Chronic sinusitis   . History of migraine  headaches   . Hypercholesterolemia   . Migraines     Past Surgical History:  Procedure Laterality Date  . BREAST EXCISIONAL BIOPSY Left 1986  . CESAREAN SECTION  1992  . LAPAROSCOPIC CHOLECYSTECTOMY  2008  . LIPOSUCTION    . SEPTOPLASTY  1993  . SKIN CANCER EXCISION  1998   basal cell carcinoma of the scalp  . TOTAL ABDOMINAL HYSTERECTOMY W/ BILATERAL SALPINGOOPHORECTOMY     lysis of adhesions    Family History  Problem Relation Age of Onset  . Rheumatic fever Father   . Hypertension Mother   . Hyperlipidemia Mother   . Diabetes Mother   . Hypertension Maternal Grandmother   . Diabetes Brother   . Breast cancer Maternal Aunt        great  . Breast cancer Paternal Aunt        great  . Colon cancer Neg Hx     SOCIAL HX: reviewed.    Current Outpatient Medications:  .  ondansetron (ZOFRAN ODT) 4 MG disintegrating tablet, Take 1 tablet (4 mg total) by mouth 2 (two) times daily as needed for nausea or vomiting., Disp: 20 tablet, Rfl: 0 .  ibuprofen (ADVIL,MOTRIN) 600 MG tablet, Take 600 mg by mouth every 6 (six) hours as needed., Disp: , Rfl:  .  Simethicone (GAS-X EXTRA STRENGTH) 125 MG CAPS, Take by mouth as needed., Disp: , Rfl:  .  triamcinolone ointment (KENALOG) 0.1 %, Apply 1 application topically 2 (two) times daily. Avoid F/G/A, Disp: ,  Rfl:   EXAM:  VITALS per patient if applicable: temp 478  GENERAL: alert, oriented, appears well and in no acute distress  HEENT: atraumatic, conjunttiva clear, no obvious abnormalities on inspection of external nose and ears  NECK: normal movements of the head and neck  LUNGS: on inspection no signs of respiratory distress, breathing rate appears normal, no obvious gross SOB, gasping or wheezing  CV: no obvious cyanosis  PSYCH/NEURO: pleasant and cooperative, no obvious depression or anxiety, speech and thought processing grossly intact  ASSESSMENT AND PLAN:  Discussed the following assessment and plan:  Problem List  Items Addressed This Visit    Close exposure to COVID-19 virus    Son with covid.  Symptoms and treatment as outlined.  Will obtain nasal swab.        Cough    With cough, congestion, fever, headache and body aches.  Son diagnosed with covid.  Previous rapid test and pcr negative for covid.  Will recheck nasal swab - flu, covid and rsv.  Treat with saline nasal spray and steroid nasal spray as directed.  mucinex DM/robitussin DM as directed.  zofran to help with nausea.  Bland foods.  Advance diet as tolerated.  Rest. Fluids.  Call with update.  She is not vaccinated.  Symptoms began 05/14/20. Out of window for monoclonal ab infusion, etc.  Follow closely.  Discussed quarantine guidelines.           I discussed the assessment and treatment plan with the patient. The patient was provided an opportunity to ask questions and all were answered. The patient agreed with the plan and demonstrated an understanding of the instructions.   The patient was advised to call back or seek an in-person evaluation if the symptoms worsen or if the condition fails to improve as anticipated.    Einar Pheasant, MD

## 2020-05-22 NOTE — Telephone Encounter (Signed)
Pt scheduled with Dr Scott 

## 2020-05-22 NOTE — Telephone Encounter (Signed)
My chart message sent for update.  

## 2020-05-23 ENCOUNTER — Other Ambulatory Visit: Payer: Self-pay

## 2020-05-23 ENCOUNTER — Telehealth: Payer: Self-pay | Admitting: Internal Medicine

## 2020-05-23 ENCOUNTER — Encounter: Payer: Self-pay | Admitting: Internal Medicine

## 2020-05-23 ENCOUNTER — Other Ambulatory Visit: Payer: Managed Care, Other (non HMO)

## 2020-05-23 DIAGNOSIS — R52 Pain, unspecified: Secondary | ICD-10-CM

## 2020-05-23 DIAGNOSIS — Z20822 Contact with and (suspected) exposure to covid-19: Secondary | ICD-10-CM | POA: Insufficient documentation

## 2020-05-23 DIAGNOSIS — U071 COVID-19: Secondary | ICD-10-CM | POA: Insufficient documentation

## 2020-05-23 DIAGNOSIS — R059 Cough, unspecified: Secondary | ICD-10-CM | POA: Insufficient documentation

## 2020-05-23 NOTE — Telephone Encounter (Signed)
Please call pt and confirm, she will not be able to come in the office since covid positive.  I will send my chart message as well.

## 2020-05-23 NOTE — Telephone Encounter (Signed)
I have her paper work at [employer]

## 2020-05-23 NOTE — Telephone Encounter (Signed)
Pt coming at 2:00 today for nasal swab - need covid, flu and RSV. Please order and place her on the lab scheduled.  Thanks.

## 2020-05-23 NOTE — Telephone Encounter (Signed)
Meghan Valdez is going to obtain papers.

## 2020-05-23 NOTE — Telephone Encounter (Signed)
I know she has not had a covid positive test, but she has symptoms and family member with covid, etc.  Per [place] protocol, not able to come in to the office.  Can see if LaToya agreeable to obtain papers.  Will need to get ok with her.

## 2020-05-23 NOTE — Telephone Encounter (Signed)
Ok

## 2020-05-23 NOTE — Assessment & Plan Note (Addendum)
With cough, congestion, fever, headache and body aches.  Son diagnosed with covid.  Previous rapid test and pcr negative for covid.  Will recheck nasal swab - flu, covid and rsv.  Treat with saline nasal spray and steroid nasal spray as directed.  mucinex DM/robitussin DM as directed.  zofran to help with nausea.  Bland foods.  Advance diet as tolerated.  Rest. Fluids.  Call with update.  She is not vaccinated.  Symptoms began 05/14/20. Out of window for monoclonal ab infusion, etc.  Follow closely.  Discussed quarantine guidelines.

## 2020-05-23 NOTE — Telephone Encounter (Signed)
Order placed for patient to be swabbed.

## 2020-05-23 NOTE — Assessment & Plan Note (Signed)
Son with covid.  Symptoms and treatment as outlined.  Will obtain nasal swab.

## 2020-05-23 NOTE — Addendum Note (Signed)
Addended by: Lars Masson on: 05/23/2020 07:08 AM   Modules accepted: Orders

## 2020-05-24 NOTE — Telephone Encounter (Signed)
Started filling out paperwork. Waiting on COVID results

## 2020-05-26 LAB — COVID-19, FLU A+B AND RSV
Influenza A, NAA: NOT DETECTED
Influenza B, NAA: NOT DETECTED
RSV, NAA: NOT DETECTED
SARS-CoV-2, NAA: DETECTED — AB

## 2020-05-26 NOTE — Telephone Encounter (Signed)
scheduled

## 2020-05-26 NOTE — Telephone Encounter (Signed)
See if she will schedule a f/u appt Monday (virtual) to confirm symptoms and determine best return date, etc.

## 2020-05-29 ENCOUNTER — Other Ambulatory Visit: Payer: Self-pay

## 2020-05-29 ENCOUNTER — Telehealth (INDEPENDENT_AMBULATORY_CARE_PROVIDER_SITE_OTHER): Payer: Managed Care, Other (non HMO) | Admitting: Internal Medicine

## 2020-05-29 ENCOUNTER — Encounter: Payer: Self-pay | Admitting: Internal Medicine

## 2020-05-29 DIAGNOSIS — Z0289 Encounter for other administrative examinations: Secondary | ICD-10-CM

## 2020-05-29 DIAGNOSIS — U071 COVID-19: Secondary | ICD-10-CM

## 2020-05-29 NOTE — Progress Notes (Signed)
Patient ID: Meghan Valdez, female   DOB: [birth year], 60 y.o.   MRN: [id number]   Virtual Visit via virtual Note  This visit type was conducted due to national recommendations for restrictions regarding the COVID-19 pandemic (e.g. social distancing).  This format is felt to be most appropriate for this patient at this time.  All issues noted in this document were discussed and addressed.  No physical exam was performed (except for noted visual exam findings with Video Visits).   I connected with Meghan Armes by a video enabled telemedicine application and verified that I am speaking with the correct person using two identifiers. Location patient: home Location provider: work Persons participating in the virtual visit: patient, provider  The limitations, risks, security and privacy concerns of performing an evaluation and management service by video and the availability of in person appointments have been discussed. It has also been discussed with the patient that there may be a patient responsible charge related to this service. The patient expressed understanding and agreed to proceed.   Reason for visit: work in appt.   HPI: Was diagnosed with COVID 05/23/20.  Symptoms initially began 05/14/20 with headache and body aches.  See previous note.  Initial PCR negative.  05/19/20 - developed increased cough and fatigue with persistent headache and body aches.  Virtual visit 05/22/20.  zofran added for nausea.  appt to discuss covid and return to work.  Headache resolved.  05/26/20 - developed increased congestion and drainage.  Still with cough, but is no worse.  No chest congestion or sob.  Does report decreased energy and increased fatigue.  Fatigue limiting her activity.  No fever.  Increased sinus pressure.  Some sore throat related to drainage.  Using saline nasal spray and flonse.  Nausea is better.  Appetite is improving.  No vomiting.  Diarrhea resolved.     ROS: See pertinent positives and  negatives per HPI.  Past Medical History:  Diagnosis Date  . Allergic rhinitis   . Basal cell carcinoma of skin    removed 1998  . Chicken pox   . Chronic sinusitis   . History of migraine headaches   . Hypercholesterolemia   . Migraines     Past Surgical History:  Procedure Laterality Date  . BREAST EXCISIONAL BIOPSY Left 1986  . CESAREAN SECTION  1992  . LAPAROSCOPIC CHOLECYSTECTOMY  2008  . LIPOSUCTION    . SEPTOPLASTY  1993  . SKIN CANCER EXCISION  1998   basal cell carcinoma of the scalp  . TOTAL ABDOMINAL HYSTERECTOMY W/ BILATERAL SALPINGOOPHORECTOMY     lysis of adhesions    Family History  Problem Relation Age of Onset  . Rheumatic fever Father   . Hypertension Mother   . Hyperlipidemia Mother   . Diabetes Mother   . Hypertension Maternal Grandmother   . Diabetes Brother   . Breast cancer Maternal Aunt        great  . Breast cancer Paternal Aunt        great  . Colon cancer Neg Hx     SOCIAL HX: reviewed.    Current Outpatient Medications:  .  ibuprofen (ADVIL,MOTRIN) 600 MG tablet, Take 600 mg by mouth every 6 (six) hours as needed., Disp: , Rfl:  .  ondansetron (ZOFRAN ODT) 4 MG disintegrating tablet, Take 1 tablet (4 mg total) by mouth 2 (two) times daily as needed for nausea or vomiting., Disp: 20 tablet, Rfl: 0 .  Simethicone 125 MG CAPS, Take  by mouth as needed., Disp: , Rfl:  .  triamcinolone ointment (KENALOG) 0.1 %, Apply 1 application topically 2 (two) times daily. Avoid F/G/A, Disp: , Rfl:   EXAM:  GENERAL: alert, oriented, appears well and in no acute distress  HEENT: atraumatic, conjunttiva clear, no obvious abnormalities on inspection of external nose and ears  NECK: normal movements of the head and neck  LUNGS: on inspection no signs of respiratory distress, breathing rate appears normal, no obvious gross SOB, gasping or wheezing  CV: no obvious cyanosis  PSYCH/NEURO: pleasant and cooperative, no obvious depression or anxiety,  speech and thought processing grossly intact  ASSESSMENT AND PLAN:  Discussed the following assessment and plan:  Problem List Items Addressed This Visit    COVID-19 virus infection    Diagnosed with covid 05/23/20.  Symptoms began 05/14/20.  Headache has resolved.  No fever now.  No nausea.  Appetite improving.  Still with cough and congestion.  No chest congestion or sob.  Using saline nasal spray and flonase.  Add mucinex.  No increased cough.  Fatigue is a persistent issue, which is limiting her activity.  Gradually improving.  Discussed return to work.  She will remain out of work for the next few days.  May return to work 1/2 day Thursday 06/01/20 and Friday 06/02/20.  May return full time 06/05/20.  Follow symptoms.  Any change or worsening she is to notify us or be reevaluated.        Encounter for completion of form with patient    Work for completed - for specifics regarding date to return to work.            I discussed the assessment and treatment plan with the patient. The patient was provided an opportunity to ask questions and all were answered. The patient agreed with the plan and demonstrated an understanding of the instructions.   The patient was advised to call back or seek an in-person evaluation if the symptoms worsen or if the condition fails to improve as anticipated.   Einar Pheasant, MD

## 2020-05-29 NOTE — Progress Notes (Signed)
Patient tested positive for Covid. Her son had Covid first and brought this in to the house.  Patient is still having some weakness/fatigue, cough, sore throat with drainage, and runny nose.  Wanting to know when she can go back to work.

## 2020-05-29 NOTE — Assessment & Plan Note (Signed)
Work for completed - for specifics regarding date to return to work.

## 2020-05-29 NOTE — Assessment & Plan Note (Signed)
Diagnosed with covid 05/23/20.  Symptoms began 05/14/20.  Headache has resolved.  No fever now.  No nausea.  Appetite improving.  Still with cough and congestion.  No chest congestion or sob.  Using saline nasal spray and flonase.  Add mucinex.  No increased cough.  Fatigue is a persistent issue, which is limiting her activity.  Gradually improving.  Discussed return to work.  She will remain out of work for the next few days.  May return to work 1/2 day Thursday 06/01/20 and Friday 06/02/20.  May return full time 06/05/20.  Follow symptoms.  Any change or worsening she is to notify us or be reevaluated.

## 2021-02-08 ENCOUNTER — Other Ambulatory Visit: Payer: Self-pay

## 2021-02-08 ENCOUNTER — Ambulatory Visit: Payer: Managed Care, Other (non HMO) | Admitting: Internal Medicine

## 2021-02-08 ENCOUNTER — Telehealth: Payer: Self-pay | Admitting: Internal Medicine

## 2021-02-08 VITALS — BP 114/70 | HR 71 | Temp 97.8°F | Resp 16 | Ht 60.0 in | Wt 162.0 lb

## 2021-02-08 DIAGNOSIS — E7800 Pure hypercholesterolemia, unspecified: Secondary | ICD-10-CM | POA: Diagnosis not present

## 2021-02-08 DIAGNOSIS — Z1159 Encounter for screening for other viral diseases: Secondary | ICD-10-CM | POA: Diagnosis not present

## 2021-02-08 DIAGNOSIS — F439 Reaction to severe stress, unspecified: Secondary | ICD-10-CM

## 2021-02-08 DIAGNOSIS — N644 Mastodynia: Secondary | ICD-10-CM

## 2021-02-08 DIAGNOSIS — Z114 Encounter for screening for human immunodeficiency virus [HIV]: Secondary | ICD-10-CM | POA: Diagnosis not present

## 2021-02-08 MED ORDER — SERTRALINE HCL 50 MG PO TABS: ORAL_TABLET | ORAL | 3 refills | Status: DC

## 2021-02-08 NOTE — Progress Notes (Addendum)
Patient ID: Meghan Valdez, female   DOB: 03-Nov-1960, 60 y.o.   MRN: [id number]   Subjective:    Patient ID: Meghan Valdez, female    DOB: Apr 18, 1961, 60 y.o.   MRN: [id number]  This visit occurred during the SARS-CoV-2 public health emergency.  Safety protocols were in place, including screening questions prior to the visit, additional usage of staff PPE, and extensive cleaning of exam room while observing appropriate contact time as indicated for disinfecting solutions.   Patient here for work in appt.   Chief Complaint  Patient presents with   Muscle Pain   .   HPI Work in for right breast pain. Increased pain - noticed after leaning over console - right breast.  Sharp pain.  Difficult to raise arm and notices discomfort with taking a deep breath.  Taking ibuprofen 2-3x/day.  None in the last couple of days.  Is better.  No other chest pain.  No sob.  No acid reflux.  No abdominal pain.  Bowels moving.  Increased stress.  Discussed starting zoloft.    Past Medical History:  Diagnosis Date   Allergic rhinitis    Basal cell carcinoma of skin    removed 1998   Chicken pox    Chronic sinusitis    History of migraine headaches    Hypercholesterolemia    Migraines    Past Surgical History:  Procedure Laterality Date   BREAST EXCISIONAL BIOPSY Left [place]  2008   LIPOSUCTION     SEPTOPLASTY  1993   SKIN CANCER EXCISION  1998   basal cell carcinoma of the scalp   TOTAL ABDOMINAL HYSTERECTOMY W/ BILATERAL SALPINGOOPHORECTOMY     lysis of adhesions   Family History  Problem Relation Age of Onset   Rheumatic fever Father    Hypertension Mother    Hyperlipidemia Mother    Diabetes Mother    Hypertension Maternal Grandmother    Diabetes Brother    Breast cancer Maternal Aunt        great   Breast cancer Paternal Aunt        great   Colon cancer Neg Hx    Social History   Socioeconomic History   Marital status:  Divorced    Spouse name: Not on file   Number of children: 2   Years of education: Not on file   Highest education level: Not on file  Occupational History   Occupation: retired - med Designer, multimedia  Tobacco Use   Smoking status: Never   Smokeless tobacco: Never  Substance and Sexual Activity   Alcohol use: Yes    Alcohol/week: 0.0 standard drinks   Drug use: No   Sexual activity: Not on file  Other Topics Concern   Not on file  Social History Narrative   Not on file   Social Determinants of Health   Financial Resource Strain: Not on file  Food Insecurity: Not on file  Transportation Needs: Not on file  Physical Activity: Not on file  Stress: Not on file  Social Connections: Not on file     Review of Systems  Constitutional:  Negative for appetite change and unexpected weight change.  HENT:  Negative for congestion and sinus pressure.   Respiratory:  Negative for cough, chest tightness and shortness of breath.   Cardiovascular:  Negative for chest pain and palpitations.  Gastrointestinal:  Negative for abdominal pain, diarrhea, nausea and vomiting.  Genitourinary:  Negative for difficulty urinating and dysuria.  Musculoskeletal:  Negative for joint swelling and myalgias.  Skin:  Negative for color change and rash.  Neurological:  Negative for dizziness, light-headedness and headaches.  Psychiatric/Behavioral:  Negative for agitation and dysphoric mood.       Objective:     BP 114/70   Pulse 71   Temp 97.8 F (36.6 C)   Resp 16   Ht 5' (1.524 m)   Wt 162 lb (73.5 kg)   SpO2 99%   BMI 31.64 kg/m  Wt Readings from Last 3 Encounters:  02/08/21 162 lb (73.5 kg)  05/29/20 162 lb (73.5 kg)  05/22/20 159 lb (72.1 kg)    Physical Exam Vitals reviewed.  Constitutional:      General: She is not in acute distress.    Appearance: Normal appearance.  HENT:     Head: Normocephalic and atraumatic.     Right Ear: External ear normal.     Left Ear: External ear normal.   Eyes:     General: No scleral icterus.       Right eye: No discharge.        Left eye: No discharge.     Conjunctiva/sclera: Conjunctivae normal.  Neck:     Thyroid: No thyromegaly.  Cardiovascular:     Rate and Rhythm: Normal rate and regular rhythm.  Pulmonary:     Effort: No respiratory distress.     Breath sounds: Normal breath sounds. No wheezing.     Comments: Breasts:  increased pain to palpation 6-7:00 right breast.  No palpale nodules or axillary adenopathy. No nipple discharge or nipple retraction present. Abdominal:     General: Bowel sounds are normal.     Palpations: Abdomen is soft.     Tenderness: There is no abdominal tenderness.  Musculoskeletal:        General: No swelling or tenderness.     Cervical back: Neck supple. No tenderness.  Lymphadenopathy:     Cervical: No cervical adenopathy.  Skin:    Findings: No erythema or rash.  Neurological:     Mental Status: She is alert.  Psychiatric:        Mood and Affect: Mood normal.        Behavior: Behavior normal.     Outpatient Encounter Medications as of 02/08/2021  Medication Sig   sertraline (ZOLOFT) 50 MG tablet Take 1/2 tablet per day for one week and then increase to one per day.   ibuprofen (ADVIL,MOTRIN) 600 MG tablet Take 600 mg by mouth every 6 (six) hours as needed.   Simethicone 125 MG CAPS Take by mouth as needed.   triamcinolone ointment (KENALOG) 0.1 % Apply 1 application topically 2 (two) times daily. Avoid F/G/A   [DISCONTINUED] ondansetron (ZOFRAN ODT) 4 MG disintegrating tablet Take 1 tablet (4 mg total) by mouth 2 (two) times daily as needed for nausea or vomiting.   No facility-administered encounter medications on file as of 02/08/2021.     Lab Results  Component Value Date   WBC 5.9 02/08/2021   HGB 13.3 02/08/2021   HCT 39.9 02/08/2021   PLT 249 02/08/2021   GLUCOSE 81 02/08/2021   CHOL 245 (H) 02/08/2021   TRIG 97 02/08/2021   HDL 72 02/08/2021   LDLDIRECT 158.1 03/18/2013    LDLCALC 156 (H) 02/08/2021   ALT 23 02/08/2021   AST 23 02/08/2021   NA 141 02/08/2021   K 4.1 02/08/2021   CL 101 02/08/2021  CREATININE 0.72 02/08/2021   BUN 20 02/08/2021   CO2 23 02/08/2021   TSH 1.650 02/08/2021    MM Digital Screening  Result Date: 07/16/2017 CLINICAL DATA:  Screening. Prior benign excisional biopsy of the left breast in 1986. EXAM: DIGITAL SCREENING BILATERAL MAMMOGRAM WITH CAD COMPARISON:  Previous exam(s). ACR Breast Density Category b: There are scattered areas of fibroglandular density. FINDINGS: There are no findings suspicious for malignancy. Images were processed with CAD. IMPRESSION: No mammographic evidence of malignancy. A result letter of this screening mammogram will be mailed directly to the patient. RECOMMENDATION: Screening mammogram in one year. (Code:SM-B-01Y) BI-RADS CATEGORY  2: Benign. Electronically Signed   By: Evangeline Dakin M.D.   On: 07/16/2017 12:55       Assessment & Plan:   Problem List Items Addressed This Visit     Breast pain    Breast pain as outlined.  Schedule diagnostic mammogram with possible ultrasound.  Further w/up pending results.        Relevant Orders   MM DIAG BREAST TOMO BILATERAL (Completed)   US BREAST LTD UNI RIGHT INC AXILLA (Completed)   Hypercholesterolemia - Primary    Has declined cholesterol medication.  Low cholesterol diet and exercise.  Follow lipid panel.       Relevant Orders   CBC with Differential/Platelet (Completed)   Hepatic function panel (Completed)   Lipid panel (Completed)   TSH (Completed)   Basic metabolic panel (Completed)   Stress    Discussed.  Start zoloft as directed.  Follow.       Other Visit Diagnoses     Screening for HIV without presence of risk factors       Relevant Orders   HIV antibody (with reflex) (Completed)   Encounter for hepatitis C screening test for low risk patient       Relevant Orders   Hepatitis C antibody (Completed)        Einar Pheasant,  MD

## 2021-02-08 NOTE — Telephone Encounter (Signed)
Lft pt vm to call ofc to sch Diag mammo and [place]. thanks

## 2021-02-09 LAB — CBC WITH DIFFERENTIAL/PLATELET
Basophils Absolute: 0 10*3/uL (ref 0.0–0.2)
Basos: 1 %
EOS (ABSOLUTE): 0.1 10*3/uL (ref 0.0–0.4)
Eos: 2 %
Hematocrit: 39.9 % (ref 34.0–46.6)
Hemoglobin: 13.3 g/dL (ref 11.1–15.9)
Immature Grans (Abs): 0 10*3/uL (ref 0.0–0.1)
Immature Granulocytes: 1 %
Lymphocytes Absolute: 1.3 10*3/uL (ref 0.7–3.1)
Lymphs: 21 %
MCH: 30.4 pg (ref 26.6–33.0)
MCHC: 33.3 g/dL (ref 31.5–35.7)
MCV: 91 fL (ref 79–97)
Monocytes Absolute: 0.3 10*3/uL (ref 0.1–0.9)
Monocytes: 4 %
Neutrophils Absolute: 4.2 10*3/uL (ref 1.4–7.0)
Neutrophils: 71 %
Platelets: 249 10*3/uL (ref 150–450)
RBC: 4.38 x10E6/uL (ref 3.77–5.28)
RDW: 11.8 % (ref 11.7–15.4)
WBC: 5.9 10*3/uL (ref 3.4–10.8)

## 2021-02-09 LAB — LIPID PANEL
Chol/HDL Ratio: 3.4 ratio (ref 0.0–4.4)
Cholesterol, Total: 245 mg/dL — ABNORMAL HIGH (ref 100–199)
HDL: 72 mg/dL (ref >39)
LDL Chol Calc (NIH): 156 mg/dL — ABNORMAL HIGH (ref 0–99)
Triglycerides: 97 mg/dL (ref 0–149)
VLDL Cholesterol Cal: 17 mg/dL (ref 5–40)

## 2021-02-09 LAB — HEPATIC FUNCTION PANEL
ALT: 23 IU/L (ref 0–32)
AST: 23 IU/L (ref 0–40)
Albumin: 4.8 g/dL (ref 3.8–4.9)
Alkaline Phosphatase: 113 IU/L (ref 44–121)
Bilirubin Total: 0.6 mg/dL (ref 0.0–1.2)
Bilirubin, Direct: 0.1 mg/dL (ref 0.00–0.40)
Total Protein: 7.2 g/dL (ref 6.0–8.5)

## 2021-02-09 LAB — BASIC METABOLIC PANEL
BUN/Creatinine Ratio: 28 (ref 12–28)
BUN: 20 mg/dL (ref 8–27)
CO2: 23 mmol/L (ref 20–29)
Calcium: 9.7 mg/dL (ref 8.7–10.3)
Chloride: 101 mmol/L (ref 96–106)
Creatinine, Ser: 0.72 mg/dL (ref 0.57–1.00)
Glucose: 81 mg/dL (ref 70–99)
Potassium: 4.1 mmol/L (ref 3.5–5.2)
Sodium: 141 mmol/L (ref 134–144)
eGFR: 96 mL/min/{1.73_m2} (ref >59)

## 2021-02-09 LAB — TSH: TSH: 1.65 u[IU]/mL (ref 0.450–4.500)

## 2021-02-09 LAB — HIV ANTIBODY (ROUTINE TESTING W REFLEX): HIV Screen 4th Generation wRfx: NONREACTIVE

## 2021-02-09 LAB — HEPATITIS C ANTIBODY: Hep C Virus Ab: <0.1 s/co ratio (ref 0.0–0.9)

## 2021-02-16 ENCOUNTER — Ambulatory Visit: Payer: Managed Care, Other (non HMO)

## 2021-02-16 ENCOUNTER — Ambulatory Visit
Admission: RE | Admit: 2021-02-16 | Discharge: 2021-02-16 | Disposition: A | Discharge status: Home / Self Care | Payer: Managed Care, Other (non HMO) | Source: Ambulatory Visit | Attending: Internal Medicine | Admitting: Internal Medicine

## 2021-02-16 ENCOUNTER — Other Ambulatory Visit: Payer: Self-pay

## 2021-02-16 DIAGNOSIS — N644 Mastodynia: Secondary | ICD-10-CM

## 2021-02-17 ENCOUNTER — Encounter: Payer: Self-pay | Admitting: Internal Medicine

## 2021-02-17 NOTE — Assessment & Plan Note (Signed)
Breast pain as outlined.  Schedule diagnostic mammogram with possible ultrasound.  Further w/up pending results.

## 2021-02-17 NOTE — Assessment & Plan Note (Signed)
Has declined cholesterol medication.  Low cholesterol diet and exercise.  Follow lipid panel.   

## 2021-02-17 NOTE — Assessment & Plan Note (Signed)
Discussed.  Start zoloft as directed.  Follow.   

## 2021-02-19 ENCOUNTER — Telehealth: Payer: Self-pay

## 2021-02-19 NOTE — Telephone Encounter (Signed)
LM regarding mammo results.

## 2021-05-17 ENCOUNTER — Encounter: Payer: Self-pay | Admitting: Internal Medicine

## 2021-05-17 ENCOUNTER — Ambulatory Visit (INDEPENDENT_AMBULATORY_CARE_PROVIDER_SITE_OTHER): Payer: Managed Care, Other (non HMO) | Admitting: Internal Medicine

## 2021-05-17 ENCOUNTER — Other Ambulatory Visit: Payer: Self-pay

## 2021-05-17 VITALS — BP 122/80 | HR 90 | Temp 97.9°F | Resp 16 | Ht 60.0 in | Wt 160.8 lb

## 2021-05-17 DIAGNOSIS — F439 Reaction to severe stress, unspecified: Secondary | ICD-10-CM | POA: Diagnosis not present

## 2021-05-17 DIAGNOSIS — E7800 Pure hypercholesterolemia, unspecified: Secondary | ICD-10-CM

## 2021-05-17 DIAGNOSIS — Z0000 Encounter for general adult medical examination without abnormal findings: Secondary | ICD-10-CM

## 2021-05-17 DIAGNOSIS — H539 Unspecified visual disturbance: Secondary | ICD-10-CM | POA: Diagnosis not present

## 2021-05-17 NOTE — Assessment & Plan Note (Signed)
Discussed given recent concern regarding possible TIA, the need to start a statin medication.  She has declined in the past and continues to decline.  Low cholesterol diet and exercise.  Discussed zetia and repatha.  Check lipid panel today.

## 2021-05-17 NOTE — Assessment & Plan Note (Signed)
Had the vision change and what sounds like expressive aphasia as outlined.  Episode lasted 2 hours.  Discussed concern regarding possible TIA.  Discussed the need for further w/up.  Discussed ER evaluation.  Prefers outpatient w/up, but states that if symptoms recur, she will be evaluated.  Start daily aspirin.  Schedule MRI brain and carotid ultrasound.  Also referral to neurology.

## 2021-05-17 NOTE — Progress Notes (Signed)
Patient ID: Meghan Valdez, female   DOB: 04-05-1961, 61 y.o.   MRN: [id number]   Subjective:    Patient ID: Meghan L Bovey, female    DOB: [birth year], 61 y.o.   MRN: [id number]  This visit occurred during the SARS-CoV-2 public health emergency.  Safety protocols were in place, including screening questions prior to the visit, additional usage of staff PPE, and extensive cleaning of exam room while observing appropriate contact time as indicated for disinfecting solutions.   Patient here for physical exam.  .   HPI She is scheduled for physical exam.  She declined pap smear.  Had a breast exam last visit.  Visit changed to f/u appt.  Recent mammogram ok.  No further breast pain.  No chest pain or sob reported.  No abdominal pain.  Bowels moving.  She does report increased stress - work.  Worse over the last two weeks.  Reports intermittent headaches.  Had episode at work yesterday - noticed blind spot - right eye.  Described as a small circular area - similar to the change you see after you look at a bright light and then down at a paper.  States her eye felt swollen, but was not swollen.  Also, was trying to talk to a colleague and could not get her words out.  States she knew what she wanted to say, but went a "long way around" to say it.  No facial drooping.  No slurring of words.  No other neurological changes.  States the episode lasted approximately 2 hours.  No residual symptoms now.  Feels back to normal.  Was not evaluated. Eating.  No nausea or vomiting.  No headache now.    Past Medical History:  Diagnosis Date   Allergic rhinitis    Basal cell carcinoma of skin    removed 1998   Chicken pox    Chronic sinusitis    History of migraine headaches    Hypercholesterolemia    Migraines    Past Surgical History:  Procedure Laterality Date   BREAST EXCISIONAL BIOPSY Left [place]  2008   LIPOSUCTION     SEPTOPLASTY  1993    SKIN CANCER EXCISION  1998   basal cell carcinoma of the scalp   TOTAL ABDOMINAL HYSTERECTOMY W/ BILATERAL SALPINGOOPHORECTOMY     lysis of adhesions   Family History  Problem Relation Age of Onset   Rheumatic fever Father    Hypertension Mother    Hyperlipidemia Mother    Diabetes Mother    Hypertension Maternal Grandmother    Diabetes Brother    Breast cancer Maternal Aunt        great   Breast cancer Paternal Aunt        great   Colon cancer Neg Hx    Social History   Socioeconomic History   Marital status: Divorced    Spouse name: Not on file   Number of children: 2   Years of education: Not on file   Highest education level: Not on file  Occupational History   Occupation: retired - med Designer, multimedia  Tobacco Use   Smoking status: Never   Smokeless tobacco: Never  Substance and Sexual Activity   Alcohol use: Yes    Alcohol/week: 0.0 standard drinks   Drug use: No   Sexual activity: Not on file  Other Topics Concern   Not on file  Social History Narrative   Not on  file   Social Determinants of Health   Financial Resource Strain: Not on file  Food Insecurity: Not on file  Transportation Needs: Not on file  Physical Activity: Not on file  Stress: Not on file  Social Connections: Not on file     Review of Systems  Constitutional:  Negative for appetite change and unexpected weight change.  HENT:  Negative for congestion and sinus pressure.   Respiratory:  Negative for cough, chest tightness and shortness of breath.   Cardiovascular:  Negative for chest pain, palpitations and leg swelling.  Gastrointestinal:  Negative for abdominal pain, diarrhea, nausea and vomiting.  Genitourinary:  Negative for difficulty urinating and dysuria.  Musculoskeletal:  Negative for joint swelling and myalgias.  Skin:  Negative for color change and rash.  Neurological:  Negative for dizziness and light-headedness.       Headaches as outlined.    Psychiatric/Behavioral:  Negative for  agitation and dysphoric mood.        Increased stress.        Objective:     BP 122/80    Pulse 90    Temp 97.9 F (36.6 C)    Resp 16    Ht 5' (1.524 m)    Wt 160 lb 12.8 oz (72.9 kg)    SpO2 98%    BMI 31.40 kg/m  Wt Readings from Last 3 Encounters:  05/17/21 160 lb 12.8 oz (72.9 kg)  02/08/21 162 lb (73.5 kg)  05/29/20 162 lb (73.5 kg)    Physical Exam Vitals reviewed.  Constitutional:      General: She is not in acute distress.    Appearance: Normal appearance.  HENT:     Head: Normocephalic and atraumatic.     Right Ear: External ear normal.     Left Ear: External ear normal.  Eyes:     General: No scleral icterus.       Right eye: No discharge.        Left eye: No discharge.     Conjunctiva/sclera: Conjunctivae normal.  Neck:     Thyroid: No thyromegaly.  Cardiovascular:     Rate and Rhythm: Normal rate and regular rhythm.  Pulmonary:     Effort: No respiratory distress.     Breath sounds: Normal breath sounds. No wheezing.  Abdominal:     General: Bowel sounds are normal.     Palpations: Abdomen is soft.     Tenderness: There is no abdominal tenderness.  Musculoskeletal:        General: No swelling or tenderness.     Cervical back: Neck supple. No tenderness.  Lymphadenopathy:     Cervical: No cervical adenopathy.  Skin:    Findings: No erythema or rash.  Neurological:     Mental Status: She is alert.     Comments: No focal neurological deficits noted on exam.    Psychiatric:        Mood and Affect: Mood normal.        Behavior: Behavior normal.     Outpatient Encounter Medications as of 05/17/2021  Medication Sig   ibuprofen (ADVIL,MOTRIN) 600 MG tablet Take 600 mg by mouth every 6 (six) hours as needed.   sertraline (ZOLOFT) 50 MG tablet Take 1/2 tablet per day for one week and then increase to one per day.   Simethicone 125 MG CAPS Take by mouth as needed.   triamcinolone ointment (KENALOG) 0.1 % Apply 1 application topically 2 (two) times daily.  Avoid F/G/A  No facility-administered encounter medications on file as of 05/17/2021.     Lab Results  Component Value Date   WBC 5.9 02/08/2021   HGB 13.3 02/08/2021   HCT 39.9 02/08/2021   PLT 249 02/08/2021   GLUCOSE 81 02/08/2021   CHOL 245 (H) 02/08/2021   TRIG 97 02/08/2021   HDL 72 02/08/2021   LDLDIRECT 158.1 03/18/2013   LDLCALC 156 (H) 02/08/2021   ALT 23 02/08/2021   AST 23 02/08/2021   NA 141 02/08/2021   K 4.1 02/08/2021   CL 101 02/08/2021   CREATININE 0.72 02/08/2021   BUN 20 02/08/2021   CO2 23 02/08/2021   TSH 1.650 02/08/2021    US BREAST LTD UNI RIGHT INC AXILLA  Result Date: 02/16/2021 CLINICAL DATA:  Focal pain in the lower outer right breast since leaning over against the console in her car 3 weeks ago with pressure on that area. EXAM: DIGITAL DIAGNOSTIC BILATERAL MAMMOGRAM WITH TOMOSYNTHESIS AND CAD; ULTRASOUND RIGHT BREAST LIMITED TECHNIQUE: Bilateral digital diagnostic mammography and breast tomosynthesis was performed. The images were evaluated with computer-aided detection.; Targeted ultrasound examination of the right breast was performed COMPARISON:  Previous exam(s). ACR Breast Density Category b: There are scattered areas of fibroglandular density. FINDINGS: Stable mammographic appearance of the breasts with no interval findings suspicious for malignancy in either breast. On physical exam, the patient is focally tender to palpation in the lower outer right breast inferiorly. There is no palpable mass at that location. There are palpable ribs and intercostal muscle at that location. Targeted ultrasound is performed, showing normal appearing breast tissue throughout the lower outer right breast at the location focal pain and tenderness. IMPRESSION: 1. No evidence of malignancy. 2. The patient's pain and tenderness on the right is compatible with musculoskeletal pain. RECOMMENDATION: Bilateral screening mammogram in 1 year. I have discussed the findings and  recommendations with the patient. If applicable, a reminder letter will be sent to the patient regarding the next appointment. BI-RADS CATEGORY  1: Negative. Electronically Signed   By: Claudie Revering M.D.   On: 02/16/2021 15:43  MM DIAG BREAST TOMO BILATERAL  Result Date: 02/16/2021 CLINICAL DATA:  Focal pain in the lower outer right breast since leaning over against the console in her car 3 weeks ago with pressure on that area. EXAM: DIGITAL DIAGNOSTIC BILATERAL MAMMOGRAM WITH TOMOSYNTHESIS AND CAD; ULTRASOUND RIGHT BREAST LIMITED TECHNIQUE: Bilateral digital diagnostic mammography and breast tomosynthesis was performed. The images were evaluated with computer-aided detection.; Targeted ultrasound examination of the right breast was performed COMPARISON:  Previous exam(s). ACR Breast Density Category b: There are scattered areas of fibroglandular density. FINDINGS: Stable mammographic appearance of the breasts with no interval findings suspicious for malignancy in either breast. On physical exam, the patient is focally tender to palpation in the lower outer right breast inferiorly. There is no palpable mass at that location. There are palpable ribs and intercostal muscle at that location. Targeted ultrasound is performed, showing normal appearing breast tissue throughout the lower outer right breast at the location focal pain and tenderness. IMPRESSION: 1. No evidence of malignancy. 2. The patient's pain and tenderness on the right is compatible with musculoskeletal pain. RECOMMENDATION: Bilateral screening mammogram in 1 year. I have discussed the findings and recommendations with the patient. If applicable, a reminder letter will be sent to the patient regarding the next appointment. BI-RADS CATEGORY  1: Negative. Electronically Signed   By: Claudie Revering M.D.   On: 02/16/2021 15:43  Assessment & Plan:   Problem List Items Addressed This Visit     Change in vision    Had the vision change and what  sounds like expressive aphasia as outlined.  Episode lasted 2 hours.  Discussed concern regarding possible TIA.  Discussed the need for further w/up.  Discussed ER evaluation.  Prefers outpatient w/up, but states that if symptoms recur, she will be evaluated.  Start daily aspirin.  Schedule MRI brain and carotid ultrasound.  Also referral to neurology.        Relevant Orders   MR Brain W Wo Contrast   VAS US CAROTID   Health care maintenance    Physical today 05/17/21.  Colonoscopy 12/2011.  Have discussed f/u colonoscopy given family history of colon polyps. Previously declined.  Discussed today.  Given concern regarding possible TIA, will hold on referral at this time.  discussed will need in future.  Mammogram 02/16/21 (bilateral diagnostic) - [facility] I.  Declines pap smear.        Hypercholesterolemia - Primary    Discussed given recent concern regarding possible TIA, the need to start a statin medication.  She has declined in the past and continues to decline.  Low cholesterol diet and exercise.  Discussed zetia and repatha.  Check lipid panel today.       Relevant Orders   Lipid panel   Hepatic function panel   Basic metabolic panel   CBC with Differential/Platelet   Stress    Increased stress.  Discussed.  On zoloft.  Feels is helping.  Follow.          Einar Pheasant, MD

## 2021-05-17 NOTE — Assessment & Plan Note (Addendum)
Physical today 05/17/21.  Colonoscopy 12/2011.  Have discussed f/u colonoscopy given family history of colon polyps. Previously declined.  Discussed today.  Given concern regarding possible TIA, will hold on referral at this time.  discussed will need in future.  Mammogram 02/16/21 (bilateral diagnostic) - [facility] I.  Declines pap smear.

## 2021-05-17 NOTE — Patient Instructions (Signed)
Start an aspirin - daily.   [employer] (Blood pressure cuff)

## 2021-05-17 NOTE — Assessment & Plan Note (Signed)
Increased stress.  Discussed.  On zoloft.  Feels is helping.  Follow.

## 2021-05-18 ENCOUNTER — Ambulatory Visit
Admission: RE | Admit: 2021-05-18 | Discharge: 2021-05-18 | Disposition: A | Discharge status: Home / Self Care | Payer: Managed Care, Other (non HMO) | Source: Ambulatory Visit | Attending: Internal Medicine | Admitting: Internal Medicine

## 2021-05-18 DIAGNOSIS — H539 Unspecified visual disturbance: Secondary | ICD-10-CM | POA: Insufficient documentation

## 2021-05-18 LAB — CBC WITH DIFFERENTIAL/PLATELET
Basophils Absolute: 0 10*3/uL (ref 0.0–0.2)
Basos: 1 %
EOS (ABSOLUTE): 0.1 10*3/uL (ref 0.0–0.4)
Eos: 2 %
Hematocrit: 40.7 % (ref 34.0–46.6)
Hemoglobin: 13.6 g/dL (ref 11.1–15.9)
Immature Grans (Abs): 0 10*3/uL (ref 0.0–0.1)
Immature Granulocytes: 1 %
Lymphocytes Absolute: 1.4 10*3/uL (ref 0.7–3.1)
Lymphs: 25 %
MCH: 29.8 pg (ref 26.6–33.0)
MCHC: 33.4 g/dL (ref 31.5–35.7)
MCV: 89 fL (ref 79–97)
Monocytes Absolute: 0.3 10*3/uL (ref 0.1–0.9)
Monocytes: 5 %
Neutrophils Absolute: 3.7 10*3/uL (ref 1.4–7.0)
Neutrophils: 66 %
Platelets: 265 10*3/uL (ref 150–450)
RBC: 4.56 x10E6/uL (ref 3.77–5.28)
RDW: 11.9 % (ref 11.7–15.4)
WBC: 5.6 10*3/uL (ref 3.4–10.8)

## 2021-05-18 LAB — LIPID PANEL
Chol/HDL Ratio: 4.3 ratio (ref 0.0–4.4)
Cholesterol, Total: 295 mg/dL — ABNORMAL HIGH (ref 100–199)
HDL: 69 mg/dL (ref >39)
LDL Chol Calc (NIH): 211 mg/dL — ABNORMAL HIGH (ref 0–99)
Triglycerides: 89 mg/dL (ref 0–149)
VLDL Cholesterol Cal: 15 mg/dL (ref 5–40)

## 2021-05-18 LAB — HEPATIC FUNCTION PANEL
ALT: 18 IU/L (ref 0–32)
AST: 25 IU/L (ref 0–40)
Albumin: 4.9 g/dL — ABNORMAL HIGH (ref 3.8–4.8)
Alkaline Phosphatase: 106 IU/L (ref 44–121)
Bilirubin Total: 0.7 mg/dL (ref 0.0–1.2)
Bilirubin, Direct: 0.14 mg/dL (ref 0.00–0.40)
Total Protein: 7.4 g/dL (ref 6.0–8.5)

## 2021-05-18 LAB — BASIC METABOLIC PANEL
BUN/Creatinine Ratio: 23 (ref 12–28)
BUN: 19 mg/dL (ref 8–27)
CO2: 23 mmol/L (ref 20–29)
Calcium: 9.8 mg/dL (ref 8.7–10.3)
Chloride: 101 mmol/L (ref 96–106)
Creatinine, Ser: 0.83 mg/dL (ref 0.57–1.00)
Glucose: 98 mg/dL (ref 70–99)
Potassium: 4.8 mmol/L (ref 3.5–5.2)
Sodium: 141 mmol/L (ref 134–144)
eGFR: 80 mL/min/{1.73_m2} (ref >59)

## 2021-05-18 MED ORDER — GADOBUTROL 1 MMOL/ML IV SOLN
7.5000 mL | Freq: Once | INTRAVENOUS | Status: AC | PRN
  Administered 2021-05-18: 7.5 mL via INTRAVENOUS

## 2021-05-22 ENCOUNTER — Other Ambulatory Visit: Payer: Self-pay

## 2021-05-22 ENCOUNTER — Ambulatory Visit (INDEPENDENT_AMBULATORY_CARE_PROVIDER_SITE_OTHER): Payer: Managed Care, Other (non HMO)

## 2021-05-22 DIAGNOSIS — H539 Unspecified visual disturbance: Secondary | ICD-10-CM | POA: Diagnosis not present

## 2021-05-22 MED ORDER — SERTRALINE HCL 50 MG PO TABS: 50.0000 mg | ORAL_TABLET | Freq: Every day | ORAL | 2 refills | Status: DC

## 2021-06-07 ENCOUNTER — Other Ambulatory Visit: Payer: Self-pay | Admitting: Internal Medicine

## 2021-06-07 DIAGNOSIS — H539 Unspecified visual disturbance: Secondary | ICD-10-CM

## 2021-06-07 NOTE — Progress Notes (Signed)
Order placed for referral to [facility] °

## 2021-06-16 ENCOUNTER — Telehealth: Payer: Self-pay | Admitting: Internal Medicine

## 2021-06-16 NOTE — Telephone Encounter (Signed)
-----   Message from Algernon Huxley, MD sent at 05/24/2021  6:30 AM EST ----- Regarding: RE: question Most people with subclavian stenosis are relatively asymptomatic, and we often do not treat those. However, if we think her TIA may have been related to vertebrobasilar insufficiency and the subclavian lesion, I would probably just proceed with angiogram with the intention to treat. ----- Message ----- From: Einar Pheasant, MD Sent: 05/24/2021   5:46 AM EST To: Algernon Huxley, MD Subject: question                                       I saw Ms Ternes recently with concerns regarding TIA.  I reviewed the results of the carotid ultrasound.  Carotids ok.  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  Regarding her left subclavian, what would be the next best step regarding w/up.  Thank you for your help.   Amie Cowens

## 2021-06-16 NOTE — Telephone Encounter (Signed)
Late entry - discussed with [name].  Agreeable to referral to vascular surgery to see if further w/up warranted.

## 2021-06-20 ENCOUNTER — Encounter: Payer: Self-pay | Admitting: Internal Medicine

## 2021-06-20 ENCOUNTER — Ambulatory Visit: Payer: Managed Care, Other (non HMO) | Admitting: Internal Medicine

## 2021-06-20 ENCOUNTER — Other Ambulatory Visit: Payer: Self-pay

## 2021-06-20 DIAGNOSIS — F439 Reaction to severe stress, unspecified: Secondary | ICD-10-CM | POA: Diagnosis not present

## 2021-06-20 DIAGNOSIS — H539 Unspecified visual disturbance: Secondary | ICD-10-CM | POA: Diagnosis not present

## 2021-06-20 DIAGNOSIS — R9389 Abnormal findings on diagnostic imaging of other specified body structures: Secondary | ICD-10-CM

## 2021-06-20 DIAGNOSIS — E7800 Pure hypercholesterolemia, unspecified: Secondary | ICD-10-CM | POA: Diagnosis not present

## 2021-06-20 MED ORDER — SERTRALINE HCL 50 MG PO TABS: 50.0000 mg | ORAL_TABLET | Freq: Every day | ORAL | 2 refills | Status: DC

## 2021-06-20 NOTE — Progress Notes (Signed)
Patient ID: Meghan Valdez, female   DOB: 10-26-1960, 61 y.o.   MRN: [id number]   Subjective:    Patient ID: Meghan Valdez, female    DOB: October 15, 1960, 61 y.o.   MRN: [id number]  This visit occurred during the SARS-CoV-2 public health emergency.  Safety protocols were in place, including screening questions prior to the visit, additional usage of staff PPE, and extensive cleaning of exam room while observing appropriate contact time as indicated for disinfecting solutions.   Patient here for a scheduled follow up.   Chief Complaint  Patient presents with   Follow-up    Possible TIA   .   HPI Concern over possible TIA last visit.  MRI - no acute intracranial abnormality, partially empty sella, felt to be most likely incidental.  Carotid ultrasound revealed internal carotids - ok. 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 Normal flow hemodynamics were seen in the right subclavian artery. She is scheduled to follow up with neurology soon.  Is on aspirin.  Taking daily now.  No recurring symptoms.  Blood pressures - outside readings reviewed - 115-130/70s.  No chest pain or sob reported.  No abdominal pain or bowel change.  Stress is some better at work.  On zoloft.  Feels is helping.  She has a family history of cardiac issues.  Discussed calcium score.    Past Medical History:  Diagnosis Date   Allergic rhinitis    Basal cell carcinoma of skin    removed 1998   Chicken pox    Chronic sinusitis    History of migraine headaches    Hypercholesterolemia    Migraines    Past Surgical History:  Procedure Laterality Date   BREAST EXCISIONAL BIOPSY Left [place]  2008   LIPOSUCTION     SEPTOPLASTY  1993   SKIN CANCER EXCISION  1998   basal cell carcinoma of the scalp   TOTAL ABDOMINAL HYSTERECTOMY W/ BILATERAL SALPINGOOPHORECTOMY     lysis of adhesions   Family History  Problem Relation Age of Onset   Rheumatic fever Father     Hypertension Mother    Hyperlipidemia Mother    Diabetes Mother    Hypertension Maternal Grandmother    Diabetes Brother    Breast cancer Maternal Aunt        great   Breast cancer Paternal Aunt        great   Colon cancer Neg Hx    Social History   Socioeconomic History   Marital status: Divorced    Spouse name: Not on file   Number of children: 2   Years of education: Not on file   Highest education level: Not on file  Occupational History   Occupation: retired - med Designer, multimedia  Tobacco Use   Smoking status: Never   Smokeless tobacco: Never  Substance and Sexual Activity   Alcohol use: Yes    Alcohol/week: 0.0 standard drinks   Drug use: No   Sexual activity: Not on file  Other Topics Concern   Not on file  Social History Narrative   Not on file   Social Determinants of Health   Financial Resource Strain: Not on file  Food Insecurity: Not on file  Transportation Needs: Not on file  Physical Activity: Not on file  Stress: Not on file  Social Connections: Not on file     Review of Systems  Constitutional:  Negative for appetite change and unexpected  weight change.  HENT:  Negative for congestion and sinus pressure.   Respiratory:  Negative for cough, chest tightness and shortness of breath.   Cardiovascular:  Negative for chest pain, palpitations and leg swelling.  Gastrointestinal:  Negative for abdominal pain, diarrhea, nausea and vomiting.  Genitourinary:  Negative for difficulty urinating and dysuria.  Musculoskeletal:  Negative for joint swelling and myalgias.  Skin:  Negative for color change and rash.  Neurological:  Negative for dizziness, light-headedness and headaches.  Psychiatric/Behavioral:  Negative for agitation and dysphoric mood.       Objective:     BP 128/74    Pulse 78    Temp 97.9 F (36.6 C)    Ht 5' (1.524 m)    Wt 162 lb 12.8 oz (73.8 kg)    SpO2 98%    BMI 31.79 kg/m  Wt Readings from Last 3 Encounters:  06/24/21 162 lb 12.8 oz  (73.8 kg)  05/17/21 160 lb 12.8 oz (72.9 kg)  02/08/21 162 lb (73.5 kg)    Physical Exam Vitals reviewed.  Constitutional:      General: She is not in acute distress.    Appearance: Normal appearance.  HENT:     Head: Normocephalic and atraumatic.     Right Ear: External ear normal.     Left Ear: External ear normal.  Eyes:     General: No scleral icterus.       Right eye: No discharge.        Left eye: No discharge.     Conjunctiva/sclera: Conjunctivae normal.  Neck:     Thyroid: No thyromegaly.  Cardiovascular:     Rate and Rhythm: Normal rate and regular rhythm.  Pulmonary:     Effort: No respiratory distress.     Breath sounds: Normal breath sounds. No wheezing.  Abdominal:     General: Bowel sounds are normal.     Palpations: Abdomen is soft.     Tenderness: There is no abdominal tenderness.  Musculoskeletal:        General: No swelling or tenderness.     Cervical back: Neck supple. No tenderness.  Lymphadenopathy:     Cervical: No cervical adenopathy.  Skin:    Findings: No erythema or rash.  Neurological:     Mental Status: She is alert.  Psychiatric:        Mood and Affect: Mood normal.        Behavior: Behavior normal.     Outpatient Encounter Medications as of 06/20/2021  Medication Sig   ibuprofen (ADVIL,MOTRIN) 600 MG tablet Take 600 mg by mouth every 6 (six) hours as needed.   Simethicone 125 MG CAPS Take by mouth as needed.   triamcinolone ointment (KENALOG) 0.1 % Apply 1 application topically 2 (two) times daily. Avoid F/G/A   [DISCONTINUED] sertraline (ZOLOFT) 50 MG tablet Take 1 tablet (50 mg total) by mouth daily. Take 1/2 tablet per day for one week and then increase to one per day.   sertraline (ZOLOFT) 50 MG tablet Take 1 tablet (50 mg total) by mouth daily.   No facility-administered encounter medications on file as of 06/20/2021.     Lab Results  Component Value Date   WBC 5.6 05/17/2021   HGB 13.6 05/17/2021   HCT 40.7 05/17/2021   PLT  265 05/17/2021   GLUCOSE 98 05/17/2021   CHOL 295 (H) 05/17/2021   TRIG 89 05/17/2021   HDL 69 05/17/2021   LDLDIRECT 158.1 03/18/2013   LDLCALC 211 (  H) 05/17/2021   ALT 18 05/17/2021   AST 25 05/17/2021   NA 141 05/17/2021   K 4.8 05/17/2021   CL 101 05/17/2021   CREATININE 0.83 05/17/2021   BUN 19 05/17/2021   CO2 23 05/17/2021   TSH 1.650 02/08/2021    MR Brain W Wo Contrast  Result Date: 05/18/2021 CLINICAL DATA:  TIA. Episode of headache, aphasia, and right eye visual changes on 05/16/2021 lasting 3-4 hours. EXAM: MRI HEAD WITHOUT AND WITH CONTRAST TECHNIQUE: Multiplanar, multiecho pulse sequences of the brain and surrounding structures were obtained without and with intravenous contrast. CONTRAST:  7.64mL GADAVIST GADOBUTROL 1 MMOL/ML IV SOLN COMPARISON:  None. FINDINGS: Brain: There is no evidence of an acute infarct, intracranial hemorrhage, mass, midline shift, or extra-axial fluid collection. The ventricles and sulci are normal. No significant white matter disease is seen for age. No abnormal enhancement is identified. A partially empty sella is noted. Vascular: Major intracranial vascular flow voids are preserved. Skull and upper cervical spine: Unremarkable bone marrow signal. Sinuses/Orbits: Unremarkable orbits. Trace right mastoid fluid. Clear paranasal sinuses. Other: None. IMPRESSION: 1. No acute intracranial abnormality. 2. Partially empty sella, most likely incidental in this setting. Otherwise unremarkable appearance of the brain for age. Electronically Signed   By: Logan Bores M.D.   On: 05/18/2021 10:41       Assessment & Plan:   Problem List Items Addressed This Visit     Abnormal carotid ultrasound    Carotid ultrasound revealed carotids - ok.  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 Normal flow hemodynamics were seen in the right subclavian artery. Discussed with vascular surgery.  No arm pain or other symptoms.  Given concern over possible recent TIA, will  have vascular surgery evaluate with question of need for further w/up or intervention.        Relevant Orders   CT CARDIAC SCORING   Change in vision    Concern previously about possible TIA.  MRI as outlined.  On aspirin.  No recurring episodes.  Has f/u with neurology soon.  Continue daily aspirin.  F/u with vascular regarding subclavian findings.        Relevant Orders   CT CARDIAC SCORING   Hypercholesterolemia    Discussed given recent concern regarding possible TIA, the need to start a statin medication.  She has declined in the past and continues to decline.  Low cholesterol diet and exercise.  Discussed zetia and repatha. Discussed obtaining calcium score.  If increased, may help to convince her to start a cholesterol medication.        Relevant Orders   CT CARDIAC SCORING   Stress    Increased stress.  Discussed.  On zoloft.  Feels is helping.  Continue zoloft.  Follow.         Einar Pheasant, MD

## 2021-06-24 ENCOUNTER — Encounter: Payer: Self-pay | Admitting: Internal Medicine

## 2021-06-24 DIAGNOSIS — R9389 Abnormal findings on diagnostic imaging of other specified body structures: Secondary | ICD-10-CM | POA: Insufficient documentation

## 2021-06-24 NOTE — Assessment & Plan Note (Signed)
Concern previously about possible TIA.  MRI as outlined.  On aspirin.  No recurring episodes.  Has f/u with neurology soon.  Continue daily aspirin.  F/u with vascular regarding subclavian findings.

## 2021-06-24 NOTE — Assessment & Plan Note (Addendum)
Discussed given recent concern regarding possible TIA, the need to start a statin medication.  She has declined in the past and continues to decline.  Low cholesterol diet and exercise.  Discussed zetia and repatha. Discussed obtaining calcium score.  If increased, may help to convince her to start a cholesterol medication.

## 2021-06-24 NOTE — Assessment & Plan Note (Signed)
Increased stress.  Discussed.  On zoloft.  Feels is helping.  Continue zoloft.  Follow.

## 2021-06-24 NOTE — Assessment & Plan Note (Signed)
Carotid ultrasound revealed carotids - ok.  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 Normal flow hemodynamics were seen in the right subclavian artery. Discussed with vascular surgery.  No arm pain or other symptoms.  Given concern over possible recent TIA, will have vascular surgery evaluate with question of need for further w/up or intervention.

## 2021-07-11 ENCOUNTER — Ambulatory Visit
Admission: RE | Admit: 2021-07-11 | Discharge: 2021-07-11 | Disposition: A | Discharge status: Home / Self Care | Payer: Managed Care, Other (non HMO) | Source: Ambulatory Visit | Attending: Internal Medicine | Admitting: Internal Medicine

## 2021-07-11 ENCOUNTER — Other Ambulatory Visit: Payer: Self-pay

## 2021-07-11 DIAGNOSIS — H539 Unspecified visual disturbance: Secondary | ICD-10-CM | POA: Insufficient documentation

## 2021-07-11 DIAGNOSIS — E7800 Pure hypercholesterolemia, unspecified: Secondary | ICD-10-CM | POA: Insufficient documentation

## 2021-07-11 DIAGNOSIS — R9389 Abnormal findings on diagnostic imaging of other specified body structures: Secondary | ICD-10-CM | POA: Insufficient documentation

## 2021-07-13 ENCOUNTER — Telehealth: Payer: Self-pay

## 2021-07-13 NOTE — Telephone Encounter (Signed)
-----   Message from Einar Pheasant, MD sent at 07/12/2021  3:31 AM EST ----- ?Please call and notify pt that I received her CT chest results.  Please let her know that this result (to date) does not include the calcium score.  The cardiologist will review for the calcium score.  The CT interpretation by radiology does not reveal any acute abnormality - calcium score to follow.  We will let her know as soon as that is available.  ?

## 2021-07-15 ENCOUNTER — Encounter: Payer: Self-pay | Admitting: Internal Medicine

## 2021-07-15 DIAGNOSIS — G43109 Migraine with aura, not intractable, without status migrainosus: Secondary | ICD-10-CM | POA: Insufficient documentation

## 2021-08-23 ENCOUNTER — Ambulatory Visit (INDEPENDENT_AMBULATORY_CARE_PROVIDER_SITE_OTHER): Payer: Managed Care, Other (non HMO) | Admitting: Internal Medicine

## 2021-08-23 ENCOUNTER — Encounter: Payer: Self-pay | Admitting: Internal Medicine

## 2021-08-23 DIAGNOSIS — R9389 Abnormal findings on diagnostic imaging of other specified body structures: Secondary | ICD-10-CM | POA: Diagnosis not present

## 2021-08-23 DIAGNOSIS — R040 Epistaxis: Secondary | ICD-10-CM

## 2021-08-23 DIAGNOSIS — F439 Reaction to severe stress, unspecified: Secondary | ICD-10-CM

## 2021-08-23 DIAGNOSIS — G43109 Migraine with aura, not intractable, without status migrainosus: Secondary | ICD-10-CM

## 2021-08-23 DIAGNOSIS — E7800 Pure hypercholesterolemia, unspecified: Secondary | ICD-10-CM

## 2021-08-23 MED ORDER — MAGNESIUM OXIDE -MG SUPPLEMENT 400 (240 MG) MG PO TABS: 400.0000 mg | ORAL_TABLET | Freq: Every day | ORAL | 2 refills | Status: DC

## 2021-08-23 NOTE — Progress Notes (Signed)
Patient ID: [place] L May[place], female   DOB: Nov 26, 1960, 61 y.o.   MRN: [id number] ? ? ?Subjective:  ? ? Patient ID: [place] L Riffe, female    DOB: 1961-02-18, 61 y.o.   MRN: [id number] ? ?This visit occurred during the SARS-CoV-2 public health emergency.  Safety protocols were in place, including screening questions prior to the visit, additional usage of staff PPE, and extensive cleaning of exam room while observing appropriate contact time as indicated for disinfecting solutions.  ? ?Patient here for a scheduled follow up.   ? ?HPI ?Here to follow up regarding increased stress.  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  Had MRI brain.  Discussed taking mag oxide to help prevent headaches.  Has had nose bleeds.  Off aspirin now.  Discussed if nose bleeds continue, ENT referral.  Recent calcium score - 0.  No chest pain or sob reported.  No abdominal pain.  Bowels stable.  ? ? ?Past Medical History:  ?Diagnosis Date  ? Allergic rhinitis   ? Basal cell carcinoma of skin   ? removed 1998  ? Chicken pox   ? Chronic sinusitis   ? History of migraine headaches   ? Hypercholesterolemia   ? Migraines   ? ?Past Surgical History:  ?Procedure Laterality Date  ? BREAST EXCISIONAL BIOPSY Left 1986  ? [place]  ? LAPAROSCOPIC CHOLECYSTECTOMY  2008  ? LIPOSUCTION    ? SEPTOPLASTY  1993  ? SKIN CANCER EXCISION  1998  ? basal cell carcinoma of the scalp  ? TOTAL ABDOMINAL HYSTERECTOMY W/ BILATERAL SALPINGOOPHORECTOMY    ? lysis of adhesions  ? ?Family History  ?Problem Relation Age of Onset  ? Rheumatic fever Father   ? Hypertension Mother   ? Hyperlipidemia Mother   ? Diabetes Mother   ? Hypertension Maternal Grandmother   ? Diabetes Brother   ? Breast cancer Maternal Aunt   ?     great  ? Breast cancer Paternal Aunt   ?     great  ? Colon cancer Neg Hx   ? ?Social History  ? ?Socioeconomic History  ? Marital status: Divorced  ?  Spouse name: Not on file   ? Number of children: 2  ? Years of education: Not on file  ? Highest education level: Not on file  ?Occupational History  ? Occupation: retired - med Designer, multimedia  ?Tobacco Use  ? Smoking status: Never  ? Smokeless tobacco: Never  ?Substance and Sexual Activity  ? Alcohol use: Yes  ?  Alcohol/week: 0.0 standard drinks  ? Drug use: No  ? Sexual activity: Not on file  ?Other Topics Concern  ? Not on file  ?Social History Narrative  ? Not on file  ? ?Social Determinants of Health  ? ?Financial Resource Strain: Not on file  ?Food Insecurity: Not on file  ?Transportation Needs: Not on file  ?Physical Activity: Not on file  ?Stress: Not on file  ?Social Connections: Not on file  ? ? ?Review of Systems  ?Constitutional:  Negative for appetite change and unexpected weight change.  ?HENT:  Negative for congestion and sinus pressure.   ?Respiratory:  Negative for cough, chest tightness and shortness of breath.   ?Cardiovascular:  Negative for chest pain, palpitations and leg swelling.  ?Gastrointestinal:  Negative for abdominal pain, diarrhea, nausea and vomiting.  ?Genitourinary:  Negative for difficulty urinating and dysuria.  ?Musculoskeletal:  Negative for joint  swelling and myalgias.  ?Skin:  Negative for color change and rash.  ?Neurological:  Negative for dizziness, light-headedness and headaches.  ?Psychiatric/Behavioral:  Negative for agitation and dysphoric mood.   ? ?   ?Objective:  ?  ? ?BP 130/80 (BP Location: Left Arm, Patient Position: Sitting, Cuff Size: Large)   Pulse 75   Temp (!) 97.2 ?F (36.2 ?C) (Temporal)   Resp 14   Ht 5' (1.524 m)   Wt 162 lb 9.6 oz (73.8 kg)   SpO2 98%   BMI 31.76 kg/m?  ?Wt Readings from Last 3 Encounters:  ?08/23/21 162 lb 9.6 oz (73.8 kg)  ?06/24/21 162 lb 12.8 oz (73.8 kg)  ?05/17/21 160 lb 12.8 oz (72.9 kg)  ? ? ?Physical Exam ?Vitals reviewed.  ?Constitutional:   ?   General: She is not in acute distress. ?   Appearance: Normal appearance.  ?HENT:  ?   Head: Normocephalic and  atraumatic.  ?   Right Ear: External ear normal.  ?   Left Ear: External ear normal.  ?Eyes:  ?   General: No scleral icterus.    ?   Right eye: No discharge.     ?   Left eye: No discharge.  ?   Conjunctiva/sclera: Conjunctivae normal.  ?Neck:  ?   Thyroid: No thyromegaly.  ?Cardiovascular:  ?   Rate and Rhythm: Normal rate and regular rhythm.  ?Pulmonary:  ?   Effort: No respiratory distress.  ?   Breath sounds: Normal breath sounds. No wheezing.  ?Abdominal:  ?   General: Bowel sounds are normal.  ?   Palpations: Abdomen is soft.  ?   Tenderness: There is no abdominal tenderness.  ?Musculoskeletal:     ?   General: No swelling or tenderness.  ?   Cervical back: Neck supple. No tenderness.  ?Lymphadenopathy:  ?   Cervical: No cervical adenopathy.  ?Skin: ?   Findings: No erythema or rash.  ?Neurological:  ?   Mental Status: She is alert.  ?Psychiatric:     ?   Mood and Affect: Mood normal.     ?   Behavior: Behavior normal.  ? ? ? ?Outpatient Encounter Medications as of 08/23/2021  ?Medication Sig  ? ibuprofen (ADVIL,MOTRIN) 600 MG tablet Take 600 mg by mouth every 6 (six) hours as needed.  ? magnesium oxide (MAG-OX) 400 (240 Mg) MG tablet Take 1 tablet (400 mg total) by mouth daily.  ? sertraline (ZOLOFT) 50 MG tablet Take 1 tablet (50 mg total) by mouth daily.  ? Simethicone 125 MG CAPS Take by mouth as needed.  ? triamcinolone ointment (KENALOG) 0.1 % Apply 1 application topically 2 (two) times daily. Avoid F/G/A  ? ?No facility-administered encounter medications on file as of 08/23/2021.  ?  ? ?Lab Results  ?Component Value Date  ? WBC 5.6 05/17/2021  ? HGB 13.6 05/17/2021  ? HCT 40.7 05/17/2021  ? PLT 265 05/17/2021  ? GLUCOSE 98 05/17/2021  ? CHOL 295 (H) 05/17/2021  ? TRIG 89 05/17/2021  ? HDL 69 05/17/2021  ? LDLDIRECT 158.1 03/18/2013  ? LDLCALC 211 (H) 05/17/2021  ? ALT 18 05/17/2021  ? AST 25 05/17/2021  ? NA 141 05/17/2021  ? K 4.8 05/17/2021  ? CL 101 05/17/2021  ? CREATININE 0.83 05/17/2021  ? BUN 19  05/17/2021  ? CO2 23 05/17/2021  ? TSH 1.650 02/08/2021  ? ? ?CT CARDIAC SCORING ? ?Addendum Date: 07/20/2021   ?ADDENDUM REPORT: 07/20/2021 15:27  CLINICAL DATA:  Risk stratification EXAM: Coronary Calcium Score TECHNIQUE: The patient was scanned on a Siemens Somatom Definition AS scanner. Axial non-contrast 3 mm slices were carried out through the heart. The data set was analyzed on a dedicated work station and scored using the [place]. FINDINGS: Non-cardiac: See separate report from [facility]. Ascending Aorta: Normal size Pericardium: Normal Coronary arteries: Normal origin of left and right coronary arteries. Distribution of arterial calcifications if present, as noted below; LM 0 LAD 0 LCx 0 RCA 0 Total 0 IMPRESSION AND RECOMMENDATION: 1. Normal coronary calcium score of 0. Patient is low risk for coronary events. 2.  CAC 0, CAC-DRS A0. 3.  Continue heart healthy lifestyle and risk factor modification. Kate Sable Electronically Signed   By: Kate Sable M.D.   On: 07/20/2021 15:27  ? ?Result Date: 07/20/2021 ?EXAM: OVER-READ INTERPRETATION  CT CHEST The following report is an over-read performed by radiologist Dr. Aletta Edouard of [facility], [place] on 07/11/2021. This over-read does not include interpretation of cardiac or coronary anatomy or pathology. The coronary calcium score interpretation by the cardiologist is attached. COMPARISON:  None. FINDINGS: Vascular: No significant noncardiac vascular findings. Mediastinum/Nodes: Visualized mediastinum and hilar regions demonstrate no lymphadenopathy or masses. Lungs/Pleura: Visualized lungs show no evidence of pulmonary edema, consolidation, pneumothorax, nodule or pleural fluid. Upper Abdomen: No acute abnormality. Musculoskeletal: No chest wall mass or suspicious bone lesions identified. IMPRESSION: No significant incidental findings. Electronically Signed: By: Aletta Edouard M.D. On: 07/11/2021 16:52  ? ? ?   ?Assessment &  Plan:  ? ?Problem List Items Addressed This Visit   ? ? Abnormal carotid ultrasound  ?  Carotid ultrasound revealed carotids - ok.  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 Normal flow hemodynamics were seen i

## 2021-08-26 ENCOUNTER — Encounter: Payer: Self-pay | Admitting: Internal Medicine

## 2021-08-26 DIAGNOSIS — R040 Epistaxis: Secondary | ICD-10-CM | POA: Insufficient documentation

## 2021-08-26 NOTE — Assessment & Plan Note (Signed)
Carotid ultrasound revealed carotids - ok.  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 Normal flow hemodynamics were seen in the right subclavian artery. Discussed with vascular surgery.  No arm pain or other symptoms.   Discussed f/u with vascular.  Discussed that I do want her to keep appt for evaluation to complete w/up.   ?

## 2021-08-26 NOTE — Assessment & Plan Note (Signed)
Off aspirin.  Will notify me if bleeding persists.  ?

## 2021-08-26 NOTE — Assessment & Plan Note (Signed)
Increased stress.  Discussed.  On zoloft.  Feels is helping.  Continue zoloft.  Follow.  ?

## 2021-08-26 NOTE — Assessment & Plan Note (Signed)
Desires not to take cholesterol medication.  Recent calcium score 0.  Continue low cholesterol diet and exercise.  Follow lipid panel.   ?

## 2021-08-26 NOTE — Assessment & Plan Note (Signed)
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  MRI - minimal white matter microvascular ischemic and metabolic changes.  (Labs:  B12, vit D, ESR, CRP).  Start magnesium.   ?

## 2021-09-21 ENCOUNTER — Other Ambulatory Visit: Payer: Self-pay | Admitting: Internal Medicine

## 2021-10-11 ENCOUNTER — Encounter: Payer: Self-pay | Admitting: Internal Medicine

## 2021-10-11 ENCOUNTER — Ambulatory Visit: Payer: Managed Care, Other (non HMO) | Admitting: Internal Medicine

## 2021-10-11 VITALS — BP 122/74 | HR 84 | Temp 97.9°F | Resp 14 | Ht 60.0 in | Wt 161.6 lb

## 2021-10-11 DIAGNOSIS — E7800 Pure hypercholesterolemia, unspecified: Secondary | ICD-10-CM

## 2021-10-11 DIAGNOSIS — R9389 Abnormal findings on diagnostic imaging of other specified body structures: Secondary | ICD-10-CM

## 2021-10-11 DIAGNOSIS — F439 Reaction to severe stress, unspecified: Secondary | ICD-10-CM

## 2021-10-11 DIAGNOSIS — G43109 Migraine with aura, not intractable, without status migrainosus: Secondary | ICD-10-CM | POA: Diagnosis not present

## 2021-10-11 DIAGNOSIS — K760 Fatty (change of) liver, not elsewhere classified: Secondary | ICD-10-CM | POA: Insufficient documentation

## 2021-10-11 DIAGNOSIS — G479 Sleep disorder, unspecified: Secondary | ICD-10-CM

## 2021-10-11 MED ORDER — MAGNESIUM OXIDE -MG SUPPLEMENT 400 (240 MG) MG PO TABS: 400.0000 mg | ORAL_TABLET | Freq: Every day | ORAL | 2 refills | Status: DC

## 2021-10-11 NOTE — Progress Notes (Unsigned)
Patient ID: Meghan Valdez, female   DOB: Dec 30, 1960, 61 y.o.   MRN: [id number]   Subjective:    Patient ID: Meghan Valdez, female    DOB: Apr 24, 1961, 61 y.o.   MRN: [id number]  This visit occurred during the SARS-CoV-2 public health emergency.  Safety protocols were in place, including screening questions prior to the visit, additional usage of staff PPE, and extensive cleaning of exam room while observing appropriate contact time as indicated for disinfecting solutions.   Patient here for a scheduled follow up.   Chief Complaint  Patient presents with   Follow-up   Hyperlipidemia   Migraine   Stress   .   HPI She is doing better.  Decreased stress.  Has had some temporary help at work.  Feels doing well on zoloft.  Sleep is an issue.  Does not have any problems falling asleep.  Wakes up and then cannot go back to sleep.  Does snore.  Discussed the possibility of sleep apnea.  Blood pressures doing well.  Outside readings:  117-128/70-80.  No chest pain or sob reported.  No abdominal pain or bowel change reported.  Did start magnesium.  Helping with leg cramps.  Has had two migraines since last visit.  No auras.  Relieved with otc medication.  Overall feeling better.     Past Medical History:  Diagnosis Date   Allergic rhinitis    Basal cell carcinoma of skin    removed 1998   Chicken pox    Chronic sinusitis    History of migraine headaches    Hypercholesterolemia    Migraines    Past Surgical History:  Procedure Laterality Date   BREAST EXCISIONAL BIOPSY Left [place]  2008   LIPOSUCTION     SEPTOPLASTY  1993   SKIN CANCER EXCISION  1998   basal cell carcinoma of the scalp   TOTAL ABDOMINAL HYSTERECTOMY W/ BILATERAL SALPINGOOPHORECTOMY     lysis of adhesions   Family History  Problem Relation Age of Onset   Rheumatic fever Father    Hypertension Mother    Hyperlipidemia Mother    Diabetes Mother     Hypertension Maternal Grandmother    Diabetes Brother    Breast cancer Maternal Aunt        great   Breast cancer Paternal Aunt        great   Colon cancer Neg Hx    Social History   Socioeconomic History   Marital status: Divorced    Spouse name: Not on file   Number of children: 2   Years of education: Not on file   Highest education level: Not on file  Occupational History   Occupation: retired - med Designer, multimedia  Tobacco Use   Smoking status: Never   Smokeless tobacco: Never  Substance and Sexual Activity   Alcohol use: Yes    Alcohol/week: 0.0 standard drinks   Drug use: No   Sexual activity: Not on file  Other Topics Concern   Not on file  Social History Narrative   Not on file   Social Determinants of Health   Financial Resource Strain: Not on file  Food Insecurity: Not on file  Transportation Needs: Not on file  Physical Activity: Not on file  Stress: Not on file  Social Connections: Not on file     Review of Systems     Objective:     BP 122/74 (BP Location:  Left Arm, Patient Position: Sitting, Cuff Size: Small)   Pulse 84   Temp 97.9 F (36.6 C) (Temporal)   Resp 14   Ht 5' (1.524 m)   Wt 161 lb 9.6 oz (73.3 kg)   SpO2 96%   BMI 31.56 kg/m  Wt Readings from Last 3 Encounters:  10/11/21 161 lb 9.6 oz (73.3 kg)  08/23/21 162 lb 9.6 oz (73.8 kg)  06/24/21 162 lb 12.8 oz (73.8 kg)    Physical Exam   Outpatient Encounter Medications as of 10/11/2021  Medication Sig   ibuprofen (ADVIL,MOTRIN) 600 MG tablet Take 600 mg by mouth every 6 (six) hours as needed.   magnesium oxide (MAG-OX) 400 (240 Mg) MG tablet Take 1 tablet (400 mg total) by mouth daily.   sertraline (ZOLOFT) 50 MG tablet TAKE 1 TABLET(50 MG) BY MOUTH DAILY   Simethicone 125 MG CAPS Take by mouth as needed.   triamcinolone ointment (KENALOG) 0.1 % Apply 1 application topically 2 (two) times daily. Avoid F/G/A   No facility-administered encounter medications on file as of 10/11/2021.      Lab Results  Component Value Date   WBC 5.6 05/17/2021   HGB 13.6 05/17/2021   HCT 40.7 05/17/2021   PLT 265 05/17/2021   GLUCOSE 98 05/17/2021   CHOL 295 (H) 05/17/2021   TRIG 89 05/17/2021   HDL 69 05/17/2021   LDLDIRECT 158.1 03/18/2013   LDLCALC 211 (H) 05/17/2021   ALT 18 05/17/2021   AST 25 05/17/2021   NA 141 05/17/2021   K 4.8 05/17/2021   CL 101 05/17/2021   CREATININE 0.83 05/17/2021   BUN 19 05/17/2021   CO2 23 05/17/2021   TSH 1.650 02/08/2021    CT CARDIAC SCORING  Addendum Date: 07/20/2021   ADDENDUM REPORT: 07/20/2021 15:27 CLINICAL DATA:  Risk stratification EXAM: Coronary Calcium Score TECHNIQUE: The patient was scanned on a Siemens Somatom Definition AS scanner. Axial non-contrast 3 mm slices were carried out through the heart. The data set was analyzed on a dedicated work station and scored using the [place]. FINDINGS: Non-cardiac: See separate report from [facility]. Ascending Aorta: Normal size Pericardium: Normal Coronary arteries: Normal origin of left and right coronary arteries. Distribution of arterial calcifications if present, as noted below; LM 0 LAD 0 LCx 0 RCA 0 Total 0 IMPRESSION AND RECOMMENDATION: 1. Normal coronary calcium score of 0. Patient is low risk for coronary events. 2.  CAC 0, CAC-DRS A0. 3.  Continue heart healthy lifestyle and risk factor modification. Kate Sable Electronically Signed   By: Kate Sable M.D.   On: 07/20/2021 15:27   Result Date: 07/20/2021 EXAM: OVER-READ INTERPRETATION  CT CHEST The following report is an over-read performed by radiologist Dr. Aletta Edouard of [facility], [place] on 07/11/2021. This over-read does not include interpretation of cardiac or coronary anatomy or pathology. The coronary calcium score interpretation by the cardiologist is attached. COMPARISON:  None. FINDINGS: Vascular: No significant noncardiac vascular findings. Mediastinum/Nodes: Visualized mediastinum and  hilar regions demonstrate no lymphadenopathy or masses. Lungs/Pleura: Visualized lungs show no evidence of pulmonary edema, consolidation, pneumothorax, nodule or pleural fluid. Upper Abdomen: No acute abnormality. Musculoskeletal: No chest wall mass or suspicious bone lesions identified. IMPRESSION: No significant incidental findings. Electronically Signed: By: Aletta Edouard M.D. On: 07/11/2021 16:52       Assessment & Plan:   Problem List Items Addressed This Visit   None    Einar Pheasant, MD

## 2021-10-13 ENCOUNTER — Encounter: Payer: Self-pay | Admitting: Internal Medicine

## 2021-10-13 DIAGNOSIS — G479 Sleep disorder, unspecified: Secondary | ICD-10-CM | POA: Insufficient documentation

## 2021-10-13 NOTE — Assessment & Plan Note (Signed)
Increased stress.  Discussed.  Is some better. On zoloft.  Feels is helping.  Continue zoloft.  Follow.

## 2021-10-13 NOTE — Assessment & Plan Note (Signed)
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  MRI - minimal white matter microvascular ischemic and metabolic changes.  Started magnesium.  Headaches as outlined.  Follow.

## 2021-10-13 NOTE — Assessment & Plan Note (Signed)
Desires not to take cholesterol medication.  Recent calcium score 0.  Continue low cholesterol diet and exercise.  Follow lipid panel.

## 2021-10-13 NOTE — Assessment & Plan Note (Signed)
Carotid ultrasound revealed carotids - ok.  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 Normal flow hemodynamics were seen in the right subclavian artery. Discussed with vascular surgery.  No arm pain or other symptoms.   Discussed f/u with vascular.  Discussed that I do want her to keep appt for evaluation to complete w/up.  She will call and get scheduled.  Referral in place.

## 2021-10-13 NOTE — Assessment & Plan Note (Signed)
Discussed using melatonin.  Discussed sleep apnea.  Want to follow.  Notify me if persistent problems.

## 2021-10-13 NOTE — Assessment & Plan Note (Signed)
Changes on a previous scan that could be c/w fatty liver.  Check liver panel.  Request [place] fibrosure.

## 2021-10-16 LAB — BASIC METABOLIC PANEL
BUN/Creatinine Ratio: 32 — ABNORMAL HIGH (ref 12–28)
BUN: 22 mg/dL (ref 8–27)
CO2: 22 mmol/L (ref 20–29)
Calcium: 9.6 mg/dL (ref 8.7–10.3)
Chloride: 102 mmol/L (ref 96–106)
Creatinine, Ser: 0.69 mg/dL (ref 0.57–1.00)
Glucose: 98 mg/dL (ref 70–99)
Potassium: 4.9 mmol/L (ref 3.5–5.2)
Sodium: 142 mmol/L (ref 134–144)
eGFR: 99 mL/min/{1.73_m2} (ref >59)

## 2021-10-16 LAB — LIPID PANEL
Chol/HDL Ratio: 3.5 ratio (ref 0.0–4.4)
Cholesterol, Total: 250 mg/dL — ABNORMAL HIGH (ref 100–199)
HDL: 72 mg/dL (ref >39)
LDL Chol Calc (NIH): 164 mg/dL — ABNORMAL HIGH (ref 0–99)
Triglycerides: 81 mg/dL (ref 0–149)
VLDL Cholesterol Cal: 14 mg/dL (ref 5–40)

## 2021-10-16 LAB — NASH FIBROSURE
ALPHA 2-MACROGLOBULINS, QN: 134 mg/dL (ref 110–276)
ALT (SGPT) P5P: 18 IU/L (ref 0–40)
AST (SGOT) P5P: 21 IU/L (ref 0–40)
Apolipoprotein A-1: 180 mg/dL (ref 116–209)
Bilirubin, Total: <0.1 mg/dL (ref 0.0–1.2)
Cholesterol, Total: 272 mg/dL — ABNORMAL HIGH (ref 100–199)
GGT: 16 IU/L (ref 0–60)
Glucose: 100 mg/dL — ABNORMAL HIGH (ref 70–99)
Haptoglobin: 320 mg/dL (ref 37–355)
Triglycerides: 95 mg/dL (ref 0–149)

## 2021-10-16 LAB — HEPATIC FUNCTION PANEL
ALT: 18 IU/L (ref 0–32)
AST: 18 IU/L (ref 0–40)
Albumin: 4.4 g/dL (ref 3.8–4.8)
Alkaline Phosphatase: 98 IU/L (ref 44–121)
Bilirubin Total: 0.4 mg/dL (ref 0.0–1.2)
Bilirubin, Direct: 0.1 mg/dL (ref 0.00–0.40)
Total Protein: 7 g/dL (ref 6.0–8.5)

## 2021-12-31 ENCOUNTER — Other Ambulatory Visit: Payer: Self-pay | Admitting: Internal Medicine

## 2022-01-11 ENCOUNTER — Ambulatory Visit: Payer: Managed Care, Other (non HMO) | Admitting: Internal Medicine

## 2022-03-11 ENCOUNTER — Encounter (INDEPENDENT_AMBULATORY_CARE_PROVIDER_SITE_OTHER): Payer: Self-pay

## 2022-04-07 ENCOUNTER — Other Ambulatory Visit: Payer: Self-pay | Admitting: Internal Medicine

## 2022-07-03 ENCOUNTER — Ambulatory Visit: Payer: Managed Care, Other (non HMO) | Admitting: Dermatology

## 2022-07-03 ENCOUNTER — Encounter: Payer: Self-pay | Admitting: Dermatology

## 2022-07-03 VITALS — BP 153/86 | HR 80

## 2022-07-03 DIAGNOSIS — L578 Other skin changes due to chronic exposure to nonionizing radiation: Secondary | ICD-10-CM | POA: Diagnosis not present

## 2022-07-03 DIAGNOSIS — D485 Neoplasm of uncertain behavior of skin: Secondary | ICD-10-CM | POA: Diagnosis not present

## 2022-07-03 DIAGNOSIS — L821 Other seborrheic keratosis: Secondary | ICD-10-CM | POA: Diagnosis not present

## 2022-07-03 DIAGNOSIS — D492 Neoplasm of unspecified behavior of bone, soft tissue, and skin: Secondary | ICD-10-CM

## 2022-07-03 NOTE — Progress Notes (Addendum)
   Follow-Up Visit   Subjective  Meghan Valdez is a 62 y.o. female who presents for the following: Skin Problem (Patient here today for a place at face that is fairly new and has spread rapidly. A spot at scalp that itches, has bled and is getting larger. ).  Hx of BCC, 1998.  Family history of skin cancer - what type(s): melanoma - who affected: brother   The following portions of the chart were reviewed this encounter and updated as appropriate:   Tobacco  Allergies  Meds  Problems  Med Hx  Surg Hx  Fam Hx      Review of Systems:  No other skin or systemic complaints except as noted in HPI or Assessment and Plan.  Objective  Well appearing patient in no apparent distress; mood and affect are within normal limits.  A focused examination was performed including face, scalp. Relevant physical exam findings are noted in the Assessment and Plan.  Low Mid Forehead Brown macule with 3 small gray foci 0.8 cm       Assessment & Plan  Neoplasm of skin Low Mid Forehead  Skin / nail biopsy Type of biopsy: punch   Informed consent: discussed and consent obtained   Timeout: patient name, date of birth, surgical site, and procedure verified   Procedure prep:  Patient was prepped and draped in usual sterile fashion Prep type:  Isopropyl alcohol Anesthesia: the lesion was anesthetized in a standard fashion   Anesthetic:  1% lidocaine w/ epinephrine 1-100,000 buffered w/ 8.4% NaHCO3 Punch size:  3 mm Suture size:  6-0 Suture type: nylon   Suture removal (days):  7 Hemostasis achieved with: suture   Outcome: patient tolerated procedure well   Post-procedure details: wound care instructions given   Additional details:  Petrolatum and a pressure dressing were applied  R/o atypia  Related Procedures Anatomic Pathology Report  Actinic Damage - chronic, secondary to cumulative UV radiation exposure/sun exposure over time - diffuse scaly erythematous macules with  underlying dyspigmentation - Recommend daily broad spectrum sunscreen SPF 30+ to sun-exposed areas, reapply every 2 hours as needed.  - Recommend staying in the shade or wearing long sleeves, sun glasses (UVA+UVB protection) and wide brim hats (4-inch brim around the entire circumference of the hat). - Call for new or changing lesions.  Seborrheic Keratoses - Stuck-on, waxy, tan-brown papules and/or plaques  - Benign-appearing - Discussed benign etiology and prognosis. - Observe - Call for any changes  Return for TBSE, next available, Hx BCC.  Graciella Belton, RMA, am acting as scribe for Forest Gleason, MD .  Documentation: I have reviewed the above documentation for accuracy and completeness, and I agree with the above.  Forest Gleason, MD

## 2022-07-03 NOTE — Patient Instructions (Signed)
Wound Care Instructions  Cleanse wound gently with soap and water once a day then pat dry with clean gauze. Apply a thin coat of Petrolatum (petroleum jelly, "Vaseline") over the wound (unless you have an allergy to this). We recommend that you use a new, sterile tube of Vaseline. Do not pick or remove scabs. Do not remove the yellow or white "healing tissue" from the base of the wound.  Cover the wound with fresh, clean, nonstick gauze and secure with paper tape. You may use Band-Aids in place of gauze and tape if the wound is small enough, but would recommend trimming much of the tape off as there is often too much. Sometimes Band-Aids can irritate the skin.  You should call the office for your biopsy report after 1 week if you have not already been contacted.  If you experience any problems, such as abnormal amounts of bleeding, swelling, significant bruising, significant pain, or evidence of infection, please call the office immediately.  FOR ADULT SURGERY PATIENTS: If you need something for pain relief you may take 1 extra strength Tylenol (acetaminophen) AND 2 Ibuprofen (200mg each) together every 4 hours as needed for pain. (do not take these if you are allergic to them or if you have a reason you should not take them.) Typically, you may only need pain medication for 1 to 3 days.     Due to recent changes in healthcare laws, you may see results of your pathology and/or laboratory studies on MyChart before the doctors have had a chance to review them. We understand that in some cases there may be results that are confusing or concerning to you. Please understand that not all results are received at the same time and often the doctors may need to interpret multiple results in order to provide you with the best plan of care or course of treatment. Therefore, we ask that you please give us 2 business days to thoroughly review all your results before contacting the office for clarification. Should  we see a critical lab result, you will be contacted sooner.   If You Need Anything After Your Visit  If you have any questions or concerns for your doctor, please call our main line at [phone] and press option 4 to reach your doctor's medical assistant. If no one answers, please leave a voicemail as directed and we will return your call as soon as possible. Messages left after 4 pm will be answered the following business day.   You may also send us a message via MyChart. We typically respond to MyChart messages within 1-2 business days.  For prescription refills, please ask your pharmacy to contact our office. Our fax number is [phone].  If you have an urgent issue when the clinic is closed that cannot wait until the next business day, you can page your doctor at the number below.    Please note that while we do our best to be available for urgent issues outside of office hours, we are not available 24/7.   If you have an urgent issue and are unable to reach us, you may choose to seek medical care at your doctor's office, retail clinic, urgent care center, or emergency room.  If you have a medical emergency, please immediately call 911 or go to the emergency department.  Pager Numbers  - Dr. Kowalski: [phone]  - Dr. Moye: [phone]  - Dr. Stewart: [phone]  In the event of inclement weather, please call our main line at   [phone] for an update on the status of any delays or closures.  Dermatology Medication Tips: Please keep the boxes that topical medications come in in order to help keep track of the instructions about where and how to use these. Pharmacies typically print the medication instructions only on the boxes and not directly on the medication tubes.   If your medication is too expensive, please contact our office at [phone] option 4 or send us a message through MyChart.   We are unable to tell what your co-pay for medications will be in  advance as this is different depending on your insurance coverage. However, we may be able to find a substitute medication at lower cost or fill out paperwork to get insurance to cover a needed medication.   If a prior authorization is required to get your medication covered by your insurance company, please allow us 1-2 business days to complete this process.  Drug prices often vary depending on where the prescription is filled and some pharmacies may offer cheaper prices.  The website www.goodrx.com contains coupons for medications through different pharmacies. The prices here do not account for what the cost may be with help from insurance (it may be cheaper with your insurance), but the website can give you the price if you did not use any insurance.  - You can print the associated coupon and take it with your prescription to the pharmacy.  - You may also stop by our office during regular business hours and pick up a GoodRx coupon card.  - If you need your prescription sent electronically to a different pharmacy, notify our office through [facility] MyChart or by phone at [phone] option 4.     Si Usted Necesita Algo Despus de Su Visita  Tambin puede enviarnos un mensaje a travs de MyChart. Por lo general respondemos a los mensajes de MyChart en el transcurso de 1 a 2 das hbiles.  Para renovar recetas, por favor pida a su farmacia que se ponga en contacto con nuestra oficina. Nuestro nmero de fax es el [phone].  Si tiene un asunto urgente cuando la clnica est cerrada y que no puede esperar hasta el siguiente da hbil, puede llamar/localizar a su doctor(a) al nmero que aparece a continuacin.   Por favor, tenga en cuenta que aunque hacemos todo lo posible para estar disponibles para asuntos urgentes fuera del horario de oficina, no estamos disponibles las 24 horas del da, los 7 das de la semana.   Si tiene un problema urgente y no puede comunicarse con nosotros, puede  optar por buscar atencin mdica  en el consultorio de su doctor(a), en una clnica privada, en un centro de atencin urgente o en una sala de emergencias.  Si tiene una emergencia mdica, por favor llame inmediatamente al 911 o vaya a la sala de emergencias.  Nmeros de bper  - Dr. Kowalski: [phone]  - Dra. Moye: [phone]  - Dra. Stewart: [phone]  En caso de inclemencias del tiempo, por favor llame a nuestra lnea principal al [phone] para una actualizacin sobre el estado de cualquier retraso o cierre.  Consejos para la medicacin en dermatologa: Por favor, guarde las cajas en las que vienen los medicamentos de uso tpico para ayudarle a seguir las instrucciones sobre dnde y cmo usarlos. Las farmacias generalmente imprimen las instrucciones del medicamento slo en las cajas y no directamente en los tubos del medicamento.   Si su medicamento es muy caro, por favor, pngase en contacto con   nuestra oficina llamando al [phone] y presione la opcin 4 o envenos un mensaje a travs de MyChart.   No podemos decirle cul ser su copago por los medicamentos por adelantado ya que esto es diferente dependiendo de la cobertura de su seguro. Sin embargo, es posible que podamos encontrar un medicamento sustituto a menor costo o llenar un formulario para que el seguro cubra el medicamento que se considera necesario.   Si se requiere una autorizacin previa para que su compaa de seguros cubra su medicamento, por favor permtanos de 1 a 2 das hbiles para completar este proceso.  Los precios de los medicamentos varan con frecuencia dependiendo del lugar de dnde se surte la receta y alguna farmacias pueden ofrecer precios ms baratos.  El sitio web www.goodrx.com tiene cupones para medicamentos de diferentes farmacias. Los precios aqu no tienen en cuenta lo que podra costar con la ayuda del seguro (puede ser ms barato con su seguro), pero el sitio web puede darle el  precio si no utiliz ningn seguro.  - Puede imprimir el cupn correspondiente y llevarlo con su receta a la farmacia.  - Tambin puede pasar por nuestra oficina durante el horario de atencin regular y recoger una tarjeta de cupones de GoodRx.  - Si necesita que su receta se enve electrnicamente a una farmacia diferente, informe a nuestra oficina a travs de MyChart de [place] o por telfono llamando al [phone] y presione la opcin 4.  

## 2022-07-10 ENCOUNTER — Ambulatory Visit (INDEPENDENT_AMBULATORY_CARE_PROVIDER_SITE_OTHER): Payer: Managed Care, Other (non HMO) | Admitting: Dermatology

## 2022-07-10 DIAGNOSIS — Z4802 Encounter for removal of sutures: Secondary | ICD-10-CM

## 2022-07-10 DIAGNOSIS — L814 Other melanin hyperpigmentation: Secondary | ICD-10-CM

## 2022-07-10 NOTE — Patient Instructions (Signed)
After Suture Removal  After sutures are removed, the wound should be coated with an antibiotic ointment (eg. Polysporin, Bacitracin) and, if possible, kept covered with a Band-Aid or bandage for an additional 24 hours.  After that, no additional wound care is generally needed.  It is alright to get the area wet. If a skin cancer was removed, your skin should be re-examined in approximately three months.    Recommend Serica moisturizing scar formula cream every night or [facility] brand or Mederma silicone scar sheet every night for the first year after a scar appears to help with scar remodeling if desired. Scars remodel on their own for a full year and will gradually improve in appearance over time.     Due to recent changes in healthcare laws, you may see results of your pathology and/or laboratory studies on MyChart before the doctors have had a chance to review them. We understand that in some cases there may be results that are confusing or concerning to you. Please understand that not all results are received at the same time and often the doctors may need to interpret multiple results in order to provide you with the best plan of care or course of treatment. Therefore, we ask that you please give [place] 2 business days to thoroughly review all your results before contacting the office for clarification. Should we see a critical lab result, you will be contacted sooner.   If You Need Anything After Your Visit  If you have any questions or concerns for your doctor, please call our main line at [phone] and press option 4 to reach your doctor's medical assistant. If no one answers, please leave a voicemail as directed and we will return your call as soon as possible. Messages left after 4 pm will be answered the following business day.   You may also send [place] a message via [place]. We typically respond to MyChart messages within 1-2 business days.  For prescription refills, please ask your  pharmacy to contact our office. Our fax number is [phone].  If you have an urgent issue when the clinic is closed that cannot wait until the next business day, you can page your doctor at the number below.    Please note that while we do our best to be available for urgent issues outside of office hours, we are not available 24/7.   If you have an urgent issue and are unable to reach [place], you may choose to seek medical care at your doctor's office, retail clinic, urgent care center, or emergency room.  If you have a medical emergency, please immediately call 911 or go to the emergency department.  Pager Numbers  - Dr. Nehemiah Massed: [phone]  - Dr. Laurence Ferrari: [phone]  - Dr. Nicole Kindred: [phone]  In the event of inclement weather, please call our main line at [phone] for an update on the status of any delays or closures.  Dermatology Medication Tips: Please keep the boxes that topical medications come in in order to help keep track of the instructions about where and how to use these. Pharmacies typically print the medication instructions only on the boxes and not directly on the medication tubes.   If your medication is too expensive, please contact our office at [phone] option 4 or send [place] a message through [place].   We are unable to tell what your co-pay for medications will be in advance as this is different depending on your insurance coverage. However, we may be able to  find a substitute medication at lower cost or fill out paperwork to get insurance to cover a needed medication.   If a prior authorization is required to get your medication covered by your insurance company, please allow [place] 1-2 business days to complete this process.  Drug prices often vary depending on where the prescription is filled and some pharmacies may offer cheaper prices.  The website www.goodrx.com contains coupons for medications through different pharmacies. The prices here do not  account for what the cost may be with help from insurance (it may be cheaper with your insurance), but the website can give you the price if you did not use any insurance.  - You can print the associated coupon and take it with your prescription to the pharmacy.  - You may also stop by our office during regular business hours and pick up a GoodRx coupon card.  - If you need your prescription sent electronically to a different pharmacy, notify our office through [facility] or by phone at [phone] option 4.     Si Usted Necesita Algo Despus de Su Visita  Tambin puede enviarnos un mensaje a travs de Pharmacist, community. Por lo general respondemos a los mensajes de MyChart en el transcurso de 1 a 2 das hbiles.  Para renovar recetas, por favor pida a su farmacia que se ponga en contacto con nuestra oficina. Harland Dingwall de fax es [place] [phone].  Si tiene un asunto urgente cuando la clnica est cerrada y que no puede esperar hasta el siguiente da hbil, puede llamar/localizar a su doctor(a) al nmero que aparece a continuacin.   Por favor, tenga en cuenta que aunque hacemos todo lo posible para estar disponibles para asuntos urgentes fuera del horario de [place], no estamos disponibles las 24 horas del da, los 7 das de la [place].   Si tiene un problema urgente y no puede comunicarse con nosotros, puede optar por buscar atencin mdica  en el consultorio de su doctor(a), en una clnica privada, en un centro de atencin urgente o en una sala de emergencias.  Si tiene Engineering geologist, por favor llame inmediatamente al 911 o vaya a la sala de emergencias.  Nmeros de bper  - Dr. Nehemiah Massed: [phone]  - Dra. Moye: [phone]  - Dra. Nicole Kindred: [phone]  En caso de inclemencias del [place], por favor llame a Johnsie Kindred principal al [phone] para una actualizacin sobre el [place] de cualquier retraso o cierre.  Consejos para la medicacin en dermatologa: Por  favor, guarde las cajas en las que vienen los medicamentos de uso tpico para ayudarle a seguir las instrucciones sobre dnde y cmo usarlos. Las farmacias generalmente imprimen las instrucciones del medicamento slo en las cajas y no directamente en los tubos del [place].   Si su medicamento es muy caro, por favor, pngase en contacto con Zigmund Daniel llamando al [phone] y presione la opcin 4 o envenos un mensaje a travs de Pharmacist, community.   No podemos decirle cul ser su copago por los medicamentos por adelantado ya que esto es diferente dependiendo de la cobertura de su seguro. Sin embargo, es posible que podamos encontrar un medicamento sustituto a Electrical engineer un formulario para que el seguro cubra el medicamento que se considera necesario.   Si se requiere una autorizacin previa para que su compaa de seguros [place] su medicamento, por favor permtanos de 1 a 2 das hbiles para completar este proceso.  Los precios de los medicamentos varan con frecuencia dependiendo  del lugar de dnde se surte la receta y alguna farmacias pueden ofrecer precios ms baratos.  El sitio web www.goodrx.com tiene cupones para medicamentos de Airline pilot. Los precios aqu no tienen en cuenta lo que podra costar con la ayuda del seguro (puede ser ms barato con su seguro), pero el sitio web puede darle el precio si no utiliz Research scientist (physical sciences).  - Puede imprimir el cupn correspondiente y llevarlo con su receta a la farmacia.  - Tambin puede pasar por nuestra oficina durante el horario de atencin regular y Charity fundraiser una tarjeta de cupones de GoodRx.  - Si necesita que su receta se enve electrnicamente a una farmacia diferente, informe a nuestra oficina a travs de MyChart de [place] o por telfono llamando al [phone] y presione la opcin 4.

## 2022-07-11 ENCOUNTER — Telehealth: Payer: Self-pay

## 2022-07-11 LAB — ANATOMIC PATHOLOGY REPORT

## 2022-07-11 NOTE — Telephone Encounter (Addendum)
  Tried calling patient regarding results. No answer. LMOM for patient to call office.   ----- Message from Alfonso Patten, MD sent at 07/11/2022  2:32 PM EST ----- Diagnosis synopsis: Comment Comment: Specimen A-Skin Biopsy, Low Mid Forehead: LENTIGO. Specimen: Comment Comment: Specimen A-Skin Biopsy, Low Mid Forehead Signs/Symptoms: Comment Comment: Specimen A-Brown Macule with 3 Small Gray Foci 0.8cm    Freckle, no treatment needed   MAs please call. Thank you!

## 2022-07-15 ENCOUNTER — Telehealth: Payer: Self-pay

## 2022-07-15 ENCOUNTER — Encounter: Payer: Self-pay | Admitting: Dermatology

## 2022-07-15 NOTE — Progress Notes (Signed)
   Follow-Up Visit   Subjective  Meghan Valdez is a 62 y.o. female who presents for the following: Suture removal   The following portions of the chart were reviewed this encounter and updated as appropriate:   Tobacco  Allergies  Meds  Problems  Med Hx  Surg Hx  Fam Hx      Review of Systems:  No other skin or systemic complaints except as noted in HPI or Assessment and Plan.  Objective  Well appearing patient in no apparent distress; mood and affect are within normal limits.  A focused examination was performed including face. Relevant physical exam findings are noted in the Assessment and Plan.    Assessment & Plan   Encounter for Removal of Sutures - Incision site at the forehead is clean, dry and intact - Wound cleansed, sutures removed, wound cleansed and steri strips applied.  - Will call with pathology results when they arrive - Patient advised to keep steri-strips dry until they fall off. - Scars remodel for a full year. - Once steri-strips fall off, patient can apply over-the-counter silicone scar cream each night to help with scar remodeling if desired. - Patient advised to call with any concerns or if they notice any new or changing lesions.   Documentation: I have reviewed the above documentation for accuracy and completeness, and I agree with the above.  Forest Gleason, MD

## 2022-07-15 NOTE — Telephone Encounter (Signed)
-----   Message from Alfonso Patten, MD sent at 07/11/2022  2:32 PM EST ----- Diagnosis synopsis: Comment Comment: Specimen A-Skin Biopsy, Low Mid Forehead: LENTIGO. Specimen: Comment Comment: Specimen A-Skin Biopsy, Low Mid Forehead Signs/Symptoms: Comment Comment: Specimen A-Brown Macule with 3 Small Gray Foci 0.8cm    Freckle, no treatment needed   MAs please call. Thank you!

## 2022-07-16 ENCOUNTER — Encounter: Payer: Self-pay | Admitting: Dermatology

## 2022-07-30 ENCOUNTER — Encounter: Payer: Self-pay | Admitting: Dermatology

## 2022-07-30 ENCOUNTER — Ambulatory Visit: Payer: Managed Care, Other (non HMO) | Admitting: Dermatology

## 2022-07-30 VITALS — BP 134/83 | HR 77

## 2022-07-30 DIAGNOSIS — L820 Inflamed seborrheic keratosis: Secondary | ICD-10-CM | POA: Diagnosis not present

## 2022-07-30 DIAGNOSIS — L905 Scar conditions and fibrosis of skin: Secondary | ICD-10-CM

## 2022-07-30 DIAGNOSIS — D485 Neoplasm of uncertain behavior of skin: Secondary | ICD-10-CM

## 2022-07-30 DIAGNOSIS — Z85828 Personal history of other malignant neoplasm of skin: Secondary | ICD-10-CM | POA: Diagnosis not present

## 2022-07-30 DIAGNOSIS — D225 Melanocytic nevi of trunk: Secondary | ICD-10-CM

## 2022-07-30 DIAGNOSIS — L578 Other skin changes due to chronic exposure to nonionizing radiation: Secondary | ICD-10-CM | POA: Diagnosis not present

## 2022-07-30 DIAGNOSIS — L814 Other melanin hyperpigmentation: Secondary | ICD-10-CM

## 2022-07-30 DIAGNOSIS — Z1283 Encounter for screening for malignant neoplasm of skin: Secondary | ICD-10-CM

## 2022-07-30 DIAGNOSIS — D492 Neoplasm of unspecified behavior of bone, soft tissue, and skin: Secondary | ICD-10-CM

## 2022-07-30 DIAGNOSIS — L821 Other seborrheic keratosis: Secondary | ICD-10-CM

## 2022-07-30 DIAGNOSIS — D239 Other benign neoplasm of skin, unspecified: Secondary | ICD-10-CM

## 2022-07-30 HISTORY — DX: Other benign neoplasm of skin, unspecified: D23.9

## 2022-07-30 NOTE — Patient Instructions (Addendum)
Cryotherapy Aftercare  Wash gently with soap and water everyday.   Apply Vaseline and Band-Aid daily until healed.    Wound Care Instructions  Cleanse wound gently with soap and water once a day then pat dry with clean gauze. Apply a thin coat of Petrolatum (petroleum jelly, "Vaseline") over the wound (unless you have an allergy to this). We recommend that you use a new, sterile tube of Vaseline. Do not pick or remove scabs. Do not remove the yellow or white "healing tissue" from the base of the wound.  Cover the wound with fresh, clean, nonstick gauze and secure with paper tape. You may use Band-Aids in place of gauze and tape if the wound is small enough, but would recommend trimming much of the tape off as there is often too much. Sometimes Band-Aids can irritate the skin.  You should call the office for your biopsy report after 1 week if you have not already been contacted.  If you experience any problems, such as abnormal amounts of bleeding, swelling, significant bruising, significant pain, or evidence of infection, please call the office immediately.  FOR ADULT SURGERY PATIENTS: If you need something for pain relief you may take 1 extra strength Tylenol (acetaminophen) AND 2 Ibuprofen (200mg  each) together every 4 hours as needed for pain. (do not take these if you are allergic to them or if you have a reason you should not take them.) Typically, you may only need pain medication for 1 to 3 days.       Recommend taking Heliocare sun protection supplement daily in sunny weather for additional sun protection. For maximum protection on the sunniest days, you can take up to 2 capsules of regular Heliocare OR take 1 capsule of Heliocare Ultra. For prolonged exposure (such as a full day in the sun), you can repeat your dose of the supplement 4 hours after your first dose. Heliocare can be purchased at [employer], at some [facility] or at [email or web address].     Recommend daily broad  spectrum sunscreen SPF 30+ to sun-exposed areas, reapply every 2 hours as needed. Call for new or changing lesions.  Staying in the shade or wearing long sleeves, sun glasses (UVA+UVB protection) and wide brim hats (4-inch brim around the entire circumference of the hat) are also recommended for sun protection.   Recommend Serica moisturizing scar formula cream every night or [facility] brand or Mederma silicone scar sheet every night for the first year after a scar appears to help with scar remodeling if desired. Scars remodel on their own for a full year and will gradually improve in appearance over time.    Melanoma ABCDEs  Melanoma is the most dangerous type of skin cancer, and is the leading cause of death from skin disease.  You are more likely to develop melanoma if you: Have light-colored skin, light-colored eyes, or red or blond hair Spend a lot of time in the sun Tan regularly, either outdoors or in a tanning bed Have had blistering sunburns, especially during childhood Have a close family member who has had a melanoma Have atypical moles or large birthmarks  Early detection of melanoma is key since treatment is typically straightforward and cure rates are extremely high if we catch it early.   The first sign of melanoma is often a change in a mole or a new dark spot.  The ABCDE system is a way of remembering the signs of melanoma.  A for asymmetry:  The two halves do not  match. B for border:  The edges of the growth are irregular. C for color:  A mixture of colors are present instead of an even brown color. D for diameter:  Melanomas are usually (but not always) greater than 69mm - the size of a pencil eraser. E for evolution:  The spot keeps changing in size, shape, and color.  Please check your skin once per month between visits. You can use a small mirror in front and a large mirror behind you to keep an eye on the back side or your body.   If you see any new or changing  lesions before your next follow-up, please call to schedule a visit.  Please continue daily skin protection including broad spectrum sunscreen SPF 30+ to sun-exposed areas, reapplying every 2 hours as needed when you're outdoors.   Staying in the shade or wearing long sleeves, sun glasses (UVA+UVB protection) and wide brim hats (4-inch brim around the entire circumference of the hat) are also recommended for sun protection.    Due to recent changes in healthcare laws, you may see results of your pathology and/or laboratory studies on MyChart before the doctors have had a chance to review them. We understand that in some cases there may be results that are confusing or concerning to you. Please understand that not all results are received at the same time and often the doctors may need to interpret multiple results in order to provide you with the best plan of care or course of treatment. Therefore, we ask that you please give [place] 2 business days to thoroughly review all your results before contacting the office for clarification. Should we see a critical lab result, you will be contacted sooner.   If You Need Anything After Your Visit  If you have any questions or concerns for your doctor, please call our main line at [phone] and press option 4 to reach your doctor's medical assistant. If no one answers, please leave a voicemail as directed and we will return your call as soon as possible. Messages left after 4 pm will be answered the following business day.   You may also send [place] a message via [place]. We typically respond to MyChart messages within 1-2 business days.  For prescription refills, please ask your pharmacy to contact our office. Our fax number is [phone].  If you have an urgent issue when the clinic is closed that cannot wait until the next business day, you can page your doctor at the number below.    Please note that while we do our best to be available for urgent issues  outside of office hours, we are not available 24/7.   If you have an urgent issue and are unable to reach [place], you may choose to seek medical care at your doctor's office, retail clinic, urgent care center, or emergency room.  If you have a medical emergency, please immediately call 911 or go to the emergency department.  Pager Numbers  - Dr. Nehemiah Massed: [phone]  - Dr. Laurence Ferrari: [phone]  - Dr. Nicole Kindred: [phone]  In the event of inclement weather, please call our main line at [phone] for an update on the status of any delays or closures.  Dermatology Medication Tips: Please keep the boxes that topical medications come in in order to help keep track of the instructions about where and how to use these. Pharmacies typically print the medication instructions only on the boxes and not directly on the medication tubes.   If  your medication is too expensive, please contact our office at [phone] option 4 or send [place] a message through [place].   We are unable to tell what your co-pay for medications will be in advance as this is different depending on your insurance coverage. However, we may be able to find a substitute medication at lower cost or fill out paperwork to get insurance to cover a needed medication.   If a prior authorization is required to get your medication covered by your insurance company, please allow [place] 1-2 business days to complete this process.  Drug prices often vary depending on where the prescription is filled and some pharmacies may offer cheaper prices.  The website www.goodrx.com contains coupons for medications through different pharmacies. The prices here do not account for what the cost may be with help from insurance (it may be cheaper with your insurance), but the website can give you the price if you did not use any insurance.  - You can print the associated coupon and take it with your prescription to the pharmacy.  - You may also stop by our  office during regular business hours and pick up a GoodRx coupon card.  - If you need your prescription sent electronically to a different pharmacy, notify our office through [facility] or by phone at [phone] option 4.     Si Usted Necesita Algo Despus de Su Visita  Tambin puede enviarnos un mensaje a travs de Pharmacist, community. Por lo general respondemos a los mensajes de MyChart en el transcurso de 1 a 2 das hbiles.  Para renovar recetas, por favor pida a su farmacia que se ponga en contacto con nuestra oficina. Harland Dingwall de fax es [place] [phone].  Si tiene un asunto urgente cuando la clnica est cerrada y que no puede esperar hasta el siguiente da hbil, puede llamar/localizar a su doctor(a) al nmero que aparece a continuacin.   Por favor, tenga en cuenta que aunque hacemos todo lo posible para estar disponibles para asuntos urgentes fuera del horario de [place], no estamos disponibles las 24 horas del da, los 7 das de la [place].   Si tiene un problema urgente y no puede comunicarse con nosotros, puede optar por buscar atencin mdica  en el consultorio de su doctor(a), en una clnica privada, en un centro de atencin urgente o en una sala de emergencias.  Si tiene Engineering geologist, por favor llame inmediatamente al 911 o vaya a la sala de emergencias.  Nmeros de bper  - Dr. Nehemiah Massed: [phone]  - Dra. Moye: [phone]  - Dra. Nicole Kindred: [phone]  En caso de inclemencias del [place], por favor llame a Johnsie Kindred principal al [phone] para una actualizacin sobre el [place] de cualquier retraso o cierre.  Consejos para la medicacin en dermatologa: Por favor, guarde las cajas en las que vienen los medicamentos de uso tpico para ayudarle a seguir las instrucciones sobre dnde y cmo usarlos. Las farmacias generalmente imprimen las instrucciones del medicamento slo en las cajas y no directamente en los tubos del [place].   Si su  medicamento es muy caro, por favor, pngase en contacto con Zigmund Daniel llamando al [phone] y presione la opcin 4 o envenos un mensaje a travs de Pharmacist, community.   No podemos decirle cul ser su copago por los medicamentos por adelantado ya que esto es diferente dependiendo de la cobertura de su seguro. Sin embargo, es posible que podamos encontrar un medicamento sustituto a Electrical engineer un formulario  para que el seguro cubra el medicamento que se considera necesario.   Si se requiere una autorizacin previa para que su compaa de seguros [place] su medicamento, por favor permtanos de 1 a 2 das hbiles para completar este proceso.  Los precios de los medicamentos varan con frecuencia dependiendo del Environmental consultant de dnde se surte la receta y alguna farmacias pueden ofrecer precios ms baratos.  El sitio web www.goodrx.com tiene cupones para medicamentos de Airline pilot. Los precios aqu no tienen en cuenta lo que podra costar con la ayuda del seguro (puede ser ms barato con su seguro), pero el sitio web puede darle el precio si no utiliz Research scientist (physical sciences).  - Puede imprimir el cupn correspondiente y llevarlo con su receta a la farmacia.  - Tambin puede pasar por nuestra oficina durante el horario de atencin regular y Charity fundraiser una tarjeta de cupones de GoodRx.  - Si necesita que su receta se enve electrnicamente a una farmacia diferente, informe a nuestra oficina a travs de MyChart de [place] o por telfono llamando al [phone] y presione la opcin 4.

## 2022-07-30 NOTE — Progress Notes (Signed)
Follow-Up Visit   Subjective  Meghan Valdez is a 62 y.o. female who presents for the following: Skin Cancer Screening and Full Body Skin Exam. Hx of BCC  The patient presents for Total-Body Skin Exam (TBSE) for skin cancer screening and mole check. The patient has spots, moles and lesions to be evaluated, some may be new or changing and the patient has concerns that these could be cancer.  The following portions of the chart were reviewed this encounter and updated as appropriate: medications, allergies, medical history  Review of Systems:  No other skin or systemic complaints except as noted in HPI or Assessment and Plan.  Objective  Well appearing patient in no apparent distress; mood and affect are within normal limits.  A full examination was performed including scalp, head, eyes, ears, nose, lips, neck, chest, axillae, abdomen, back, buttocks, bilateral upper extremities, bilateral lower extremities, hands, feet, fingers, toes, fingernails, and toenails. All findings within normal limits unless otherwise noted below.    Left Mid Back 0.7 cm irregular med-dark brown thin papule       forehead Pink smooth macule or patch.     Assessment & Plan   History of Basal Cell Carcinoma of the Skin - No evidence of recurrence today - Recommend regular full body skin exams - Recommend daily broad spectrum sunscreen SPF 30+ to sun-exposed areas, reapply every 2 hours as needed.  - Call if any new or changing lesions are noted between office visits   Family history of skin cancer - what type(s): Melanoma - who affected: Brother  Neoplasm of skin Left Mid Back  Epidermal / dermal shaving  Lesion diameter (cm):  0.7 Informed consent: discussed and consent obtained   Patient was prepped and draped in usual sterile fashion: Area prepped with alcohol. Anesthesia: the lesion was anesthetized in a standard fashion   Anesthetic:  1% lidocaine w/ epinephrine 1-100,000 buffered  w/ 8.4% NaHCO3 Instrument used: flexible razor blade   Hemostasis achieved with: pressure, aluminum chloride and electrodesiccation   Outcome: patient tolerated procedure well   Post-procedure details: wound care instructions given   Post-procedure details comment:  Ointment and small bandage applied  Anatomic Pathology Report  Scar forehead  Recommend Serica moisturizing scar formula cream every night or [employer] brand or Mederma silicone scar sheet every night for the first year after a scar appears to help with scar remodeling if desired. Scars remodel on their own for a full year and will gradually improve in appearance over time.    INFLAMED SEBORRHEIC KERATOSIS  Symptomatic, irritating, patient would like treated.  Benign-appearing.  Call clinic for new or changing lesions.   Prior to procedure, discussed risks of blister formation, small wound, skin dyspigmentation, or rare scar following treatment. Recommend Vaseline ointment to treated areas while healing.  Destruction Procedure Note Destruction method: cryotherapy   Informed consent: discussed and consent obtained   Lesion destroyed using liquid nitrogen: Yes   Cryotherapy cycles:  2 Outcome: patient tolerated procedure well with no complications   Post-procedure details: wound care instructions given   Locations: right upper back # of Lesions Treated: 1   Lentigines, Seborrheic Keratoses, Hemangiomas - Benign normal skin lesions - Benign-appearing - Call for any changes  Melanocytic Nevi 0.8cm brown thin papule at left inguinal fold. - Tan-brown and/or pink-flesh-colored symmetric macules and papules - Benign appearing on exam today - Observation - Call clinic for new or changing moles - Recommend daily use of broad spectrum spf 30+  sunscreen to sun-exposed areas.   Actinic Damage - Chronic condition, secondary to cumulative UV/sun exposure - diffuse scaly erythematous macules with underlying  dyspigmentation - Recommend daily broad spectrum sunscreen SPF 30+ to sun-exposed areas, reapply every 2 hours as needed.  - Staying in the shade or wearing long sleeves, sun glasses (UVA+UVB protection) and wide brim hats (4-inch brim around the entire circumference of the hat) are also recommended for sun protection.  - Call for new or changing lesions.  Skin cancer screening performed today.  Return in about 1 year (around 07/30/2023) for TBSE.  I, Emelia Salisbury, CMA, am acting as scribe for Forest Gleason, MD.   Documentation: I have reviewed the above documentation for accuracy and completeness, and I agree with the above.  Forest Gleason, MD

## 2022-08-02 LAB — ANATOMIC PATHOLOGY REPORT

## 2022-08-06 ENCOUNTER — Telehealth: Payer: Self-pay

## 2022-08-06 NOTE — Telephone Encounter (Signed)
Called patient. N/A. LMOM to C/B for pathology results.

## 2022-08-06 NOTE — Telephone Encounter (Signed)
-----   Message from Alfonso Patten, MD sent at 08/06/2022  9:44 AM EDT ----- Part A-left mid back ,Skin Biopsy: JUNCTIONAL DYSPLASTIC  NEVUS WITH MILD ATYPIA, MARGINS UNINVOLVED --> recheck at annual follow-up. Call if she notices any changes.  This is a MILDLY ATYPICAL MOLE. On the spectrum from normal mole to melanoma skin cancer, this is in between but it is much closer to a normal mole.  - These typically do not progress to melanoma or cause any trouble.  - People who have a history of atypical moles do have a slightly increased risk of developing melanoma somewhere on the body, so a yearly full body skin exam by a dermatologist is recommended.  - Monthly self skin checks and daily sun protection are also recommended.  - Please call if you notice a dark spot coming back where this biopsy was taken.  - Please also call if you notice any new or changing spots anywhere else on the body before your follow-up visit.  - Please call our office or send [place] a message if you have any questions or concerns about this biopsy result.    MAs please call. Thank you!

## 2022-09-05 ENCOUNTER — Telehealth: Payer: Self-pay

## 2022-09-05 NOTE — Telephone Encounter (Signed)
Left message on voicemail to return my call.  

## 2022-09-05 NOTE — Telephone Encounter (Signed)
-----   Message from Virginia Moye, MD sent at 08/06/2022  9:44 AM EDT ----- Part A-left mid back ,Skin Biopsy: JUNCTIONAL DYSPLASTIC  NEVUS WITH MILD ATYPIA, MARGINS UNINVOLVED --> recheck at annual follow-up. Call if she notices any changes.  This is a MILDLY ATYPICAL MOLE. On the spectrum from normal mole to melanoma skin cancer, this is in between but it is much closer to a normal mole.  - These typically do not progress to melanoma or cause any trouble.  - People who have a history of atypical moles do have a slightly increased risk of developing melanoma somewhere on the body, so a yearly full body skin exam by a dermatologist is recommended.  - Monthly self skin checks and daily sun protection are also recommended.  - Please call if you notice a dark spot coming back where this biopsy was taken.  - Please also call if you notice any new or changing spots anywhere else on the body before your follow-up visit.  - Please call our office or send us a message if you have any questions or concerns about this biopsy result.    MAs please call. Thank you! 

## 2022-09-11 ENCOUNTER — Telehealth: Payer: Self-pay

## 2022-09-11 NOTE — Telephone Encounter (Signed)
-----   Message from Virginia Moye, MD sent at 08/06/2022  9:44 AM EDT ----- Part A-left mid back ,Skin Biopsy: JUNCTIONAL DYSPLASTIC  NEVUS WITH MILD ATYPIA, MARGINS UNINVOLVED --> recheck at annual follow-up. Call if she notices any changes.  This is a MILDLY ATYPICAL MOLE. On the spectrum from normal mole to melanoma skin cancer, this is in between but it is much closer to a normal mole.  - These typically do not progress to melanoma or cause any trouble.  - People who have a history of atypical moles do have a slightly increased risk of developing melanoma somewhere on the body, so a yearly full body skin exam by a dermatologist is recommended.  - Monthly self skin checks and daily sun protection are also recommended.  - Please call if you notice a dark spot coming back where this biopsy was taken.  - Please also call if you notice any new or changing spots anywhere else on the body before your follow-up visit.  - Please call our office or send us a message if you have any questions or concerns about this biopsy result.    MAs please call. Thank you! 

## 2022-09-16 ENCOUNTER — Telehealth: Payer: Self-pay

## 2022-09-16 NOTE — Telephone Encounter (Addendum)
Tried calling patient to discuss results. No answer. LMOM for patient to return call  ----- Message from Sandi Mealy, MD sent at 08/06/2022  9:44 AM EDT ----- Part A-left mid back ,Skin Biopsy: JUNCTIONAL DYSPLASTIC  NEVUS WITH MILD ATYPIA, MARGINS UNINVOLVED --> recheck at annual follow-up. Call if she notices any changes.  This is a MILDLY ATYPICAL MOLE. On the spectrum from normal mole to melanoma skin cancer, this is in between but it is much closer to a normal mole.  - These typically do not progress to melanoma or cause any trouble.  - People who have a history of atypical moles do have a slightly increased risk of developing melanoma somewhere on the body, so a yearly full body skin exam by a dermatologist is recommended.  - Monthly self skin checks and daily sun protection are also recommended.  - Please call if you notice a dark spot coming back where this biopsy was taken.  - Please also call if you notice any new or changing spots anywhere else on the body before your follow-up visit.  - Please call our office or send [place] a message if you have any questions or concerns about this biopsy result.    MAs please call. Thank you!

## 2022-09-17 NOTE — Telephone Encounter (Signed)
Patient advised of BX results .aw 

## 2022-10-21 ENCOUNTER — Ambulatory Visit
Admission: RE | Admit: 2022-10-21 | Discharge: 2022-10-21 | Disposition: A | Discharge status: Home / Self Care | Payer: Managed Care, Other (non HMO) | Source: Ambulatory Visit | Attending: Emergency Medicine | Admitting: Emergency Medicine

## 2022-10-21 VITALS — BP 131/82 | HR 82 | Temp 98.1°F | Resp 18

## 2022-10-21 DIAGNOSIS — B349 Viral infection, unspecified: Secondary | ICD-10-CM

## 2022-10-21 DIAGNOSIS — J029 Acute pharyngitis, unspecified: Secondary | ICD-10-CM | POA: Diagnosis not present

## 2022-10-21 LAB — POCT RAPID STREP A (OFFICE): Rapid Strep A Screen: NEGATIVE

## 2022-10-21 NOTE — ED Provider Notes (Signed)
Renaldo Fiddler    CSN: [id number] Arrival date & time: 10/21/22  1150      History   Chief Complaint Chief Complaint  Patient presents with   Sore Throat    Rule out Strep. Symptoms began June 6; sore throat, headache, fever, white spots on swollen tonsils, loss of appetite. - Entered by patient    HPI Meghan Valdez is a 62 y.o. female.  Patient presents with 5-day history of sore throat, fatigue, nausea, decreased appetite, headache.  No OTC medications taken today but previously treating with Tylenol and ibuprofen.  No fever, rash, cough, shortness of breath, vomiting, diarrhea or other symptoms.  Her symptoms are improving but she would like to be tested for strep.   The history is provided by the patient and medical records.    Past Medical History:  Diagnosis Date   Allergic rhinitis    Basal cell carcinoma of skin    removed 1998   Chicken pox    Chronic sinusitis    Dysplastic nevus 07/30/2022   L mid back - mild   History of migraine headaches    Hypercholesterolemia    Migraines     Patient Active Problem List   Diagnosis Date Noted   Sleep difficulties 10/13/2021   Fatty liver 10/11/2021   Frequent nosebleeds 08/26/2021   Migraine headache with aura 07/15/2021   Abnormal carotid ultrasound 06/24/2021   Change in vision 05/17/2021   Breast pain 02/08/2021   Encounter for completion of form with patient 05/29/2020   COVID-19 virus infection 05/23/2020   Cough 05/23/2020   Nummular eczema 07/17/2019   Stress 12/07/2014   Health care maintenance 08/28/2014   GERD (gastroesophageal reflux disease) 08/23/2014   UTI (urinary tract infection) 01/06/2014   Frequent UTI 03/21/2013   Vitamin D deficiency 03/21/2013   Hypercholesterolemia 04/03/2012    Past Surgical History:  Procedure Laterality Date   BREAST EXCISIONAL BIOPSY Left 1986   CESAREAN SECTION  1992   LAPAROSCOPIC CHOLECYSTECTOMY  2008   LIPOSUCTION     SEPTOPLASTY  1993   SKIN  CANCER EXCISION  1998   basal cell carcinoma of the scalp   TOTAL ABDOMINAL HYSTERECTOMY W/ BILATERAL SALPINGOOPHORECTOMY     lysis of adhesions    OB History   No obstetric history on file.      Home Medications    Prior to Admission medications   Medication Sig Start Date End Date Taking? Authorizing Provider  ibuprofen (ADVIL,MOTRIN) 600 MG tablet Take 600 mg by mouth every 6 (six) hours as needed.    [provider]  magnesium oxide (MAG-OX) 400 (240 Mg) MG tablet Take 1 tablet (400 mg total) by mouth daily. 10/11/21   Dale [place], MD  sertraline (ZOLOFT) 50 MG tablet TAKE 1/2 TABLET BY MOUTH PER DAY FOR 1 WEEK, AND THEN INCREASE TO 1 TABLET PER DAY 04/08/22   Dale [place], MD  Simethicone 125 MG CAPS Take by mouth as needed.    [provider]  triamcinolone ointment (KENALOG) 0.1 % Apply 1 application topically 2 (two) times daily. Avoid F/G/A    [provider]    Family History Family History  Problem Relation Age of Onset   Rheumatic fever Father    Hypertension Mother    Hyperlipidemia Mother    Diabetes Mother    Hypertension Maternal Grandmother    Diabetes Brother    Breast cancer Maternal Aunt        great  Breast cancer Paternal Aunt        great   Colon cancer Neg Hx     Social History Social History   Tobacco Use   Smoking status: Never   Smokeless tobacco: Never  Substance Use Topics   Alcohol use: Yes    Alcohol/week: 0.0 standard drinks of alcohol   Drug use: No     Allergies   Septra [sulfamethoxazole-trimethoprim] and Sulfa antibiotics   Review of Systems Review of Systems  Constitutional:  Positive for appetite change and fatigue. Negative for chills and fever.  HENT:  Positive for sore throat. Negative for ear pain.   Respiratory:  Negative for cough and shortness of breath.   Cardiovascular:  Negative for chest pain and palpitations.  Gastrointestinal:  Positive for nausea. Negative for abdominal  pain and vomiting.  Neurological:  Positive for headaches.  All other systems reviewed and are negative.    Physical Exam Triage Vital Signs ED Triage Vitals  Enc Vitals Group     BP      Pulse      Resp      Temp      Temp src      SpO2      Weight      Height      Head Circumference      Peak Flow      Pain Score      Pain Loc      Pain Edu?      Excl. in GC?    No data found.  Updated Vital Signs BP 131/82   Pulse 82   Temp 98.1 F (36.7 C)   Resp 18   SpO2 96%   Visual Acuity Right Eye Distance:   Left Eye Distance:   Bilateral Distance:    Right Eye Near:   Left Eye Near:    Bilateral Near:     Physical Exam Vitals and nursing note reviewed.  Constitutional:      General: She is not in acute distress.    Appearance: She is well-developed. She is not ill-appearing.  HENT:     Right Ear: Tympanic membrane normal.     Left Ear: Tympanic membrane normal.     Nose: Nose normal.     Mouth/Throat:     Mouth: Mucous membranes are moist.     Pharynx: Posterior oropharyngeal erythema present.     Comments: Few small ulcers on posterior pharynx.  Cardiovascular:     Rate and Rhythm: Normal rate and regular rhythm.     Heart sounds: Normal heart sounds.  Pulmonary:     Effort: Pulmonary effort is normal. No respiratory distress.     Breath sounds: Normal breath sounds.  Musculoskeletal:     Cervical back: Neck supple.  Skin:    General: Skin is warm and dry.  Neurological:     Mental Status: She is alert.  Psychiatric:        Mood and Affect: Mood normal.        Behavior: Behavior normal.      UC Treatments / Results  Labs (all labs ordered are listed, but only abnormal results are displayed) Labs Reviewed  POCT RAPID STREP A (OFFICE)    EKG   Radiology No results found.  Procedures Procedures (including critical care time)  Medications Ordered in UC Medications - No data to display  Initial Impression / Assessment and Plan / UC  Course  I have reviewed the triage vital  signs and the nursing notes.  Pertinent labs & imaging results that were available during my care of the patient were reviewed by me and considered in my medical decision making (see chart for details).    Viral pharyngitis, viral illness.  Rapid strep negative.  Discussed symptomatic treatment including Tylenol or ibuprofen.  Instructed patient to follow up with her PCP if symptoms are not improving.  Education provided on pharyngitis and viral illness.  She agrees to plan of care.   Final Clinical Impressions(s) / UC Diagnoses   Final diagnoses:  Viral pharyngitis  Viral illness     Discharge Instructions      The strep test is negative.    Take Tylenol as needed for fever or discomfort.      Follow-up with your primary care provider if your symptoms are not improving.         ED Prescriptions   None    PDMP not reviewed this encounter.   Mickie Bail, NP 10/21/22 [phone]

## 2022-10-21 NOTE — Discharge Instructions (Addendum)
The strep test is negative.    Take Tylenol as needed for fever or discomfort.    Follow-up with your primary care provider if your symptoms are not improving.     

## 2022-10-21 NOTE — ED Triage Notes (Signed)
Patient to Urgent Care with complaints of sore throat/ nausea/ and headaches. Reports seeing white spots w/ swollen tonsils. Possible fever.  Symptoms started June 5th. Has had some improvement in symptoms.  Has been taking tylneol and ibuprofen.

## 2023-03-10 ENCOUNTER — Ambulatory Visit (INDEPENDENT_AMBULATORY_CARE_PROVIDER_SITE_OTHER): Payer: Managed Care, Other (non HMO) | Admitting: Internal Medicine

## 2023-03-10 ENCOUNTER — Other Ambulatory Visit (HOSPITAL_COMMUNITY): Payer: Self-pay

## 2023-03-10 VITALS — BP 126/74 | HR 85 | Temp 97.9°F | Resp 16 | Ht 60.0 in | Wt 162.0 lb

## 2023-03-10 DIAGNOSIS — E7800 Pure hypercholesterolemia, unspecified: Secondary | ICD-10-CM

## 2023-03-10 DIAGNOSIS — R739 Hyperglycemia, unspecified: Secondary | ICD-10-CM

## 2023-03-10 DIAGNOSIS — Z1211 Encounter for screening for malignant neoplasm of colon: Secondary | ICD-10-CM

## 2023-03-10 DIAGNOSIS — F439 Reaction to severe stress, unspecified: Secondary | ICD-10-CM

## 2023-03-10 DIAGNOSIS — Z1231 Encounter for screening mammogram for malignant neoplasm of breast: Secondary | ICD-10-CM

## 2023-03-10 DIAGNOSIS — Z0000 Encounter for general adult medical examination without abnormal findings: Secondary | ICD-10-CM

## 2023-03-10 DIAGNOSIS — R413 Other amnesia: Secondary | ICD-10-CM

## 2023-03-10 DIAGNOSIS — Z0001 Encounter for general adult medical examination with abnormal findings: Secondary | ICD-10-CM

## 2023-03-10 DIAGNOSIS — K760 Fatty (change of) liver, not elsewhere classified: Secondary | ICD-10-CM

## 2023-03-10 MED ORDER — WEGOVY 0.25 MG/0.5ML ~~LOC~~ SOAJ: 0.2500 mg | SUBCUTANEOUS | 2 refills | Status: DC

## 2023-03-10 MED ORDER — MAGNESIUM OXIDE -MG SUPPLEMENT 400 (240 MG) MG PO TABS: 400.0000 mg | ORAL_TABLET | Freq: Every day | ORAL | 2 refills | Status: DC

## 2023-03-10 NOTE — Progress Notes (Signed)
Subjective:    Patient ID: Meghan Valdez, female    DOB: 1960/10/26, 62 y.o.   MRN: [id number]  Patient here for  Chief Complaint  Patient presents with   Annual Exam    HPI Here for a physical exam.  Increased stress recently.  Had 5 deaths in her family this year.  Reports she has good support. Does not feel needs any further intervention.  Work stress is some better.  Had questions about GLP 1 agonist.  Is interested in starting.  Discussed medication and possible side effects and contraindications.  Discussed diet and exercise.  Overdue mammogram.  Schedule colonoscopy.  Reports A1c through work - 5.7.     Past Medical History:  Diagnosis Date   Allergic rhinitis    Basal cell carcinoma of skin    removed 1998   Chicken pox    Chronic sinusitis    Dysplastic nevus 07/30/2022   L mid back - mild   History of migraine headaches    Hypercholesterolemia    Migraines    Past Surgical History:  Procedure Laterality Date   BREAST EXCISIONAL BIOPSY Left 1986   CESAREAN SECTION  1992   LAPAROSCOPIC CHOLECYSTECTOMY  2008   LIPOSUCTION     SEPTOPLASTY  1993   SKIN CANCER EXCISION  1998   basal cell carcinoma of the scalp   TOTAL ABDOMINAL HYSTERECTOMY W/ BILATERAL SALPINGOOPHORECTOMY     lysis of adhesions   Family History  Problem Relation Age of Onset   Rheumatic fever Father    Hypertension Mother    Hyperlipidemia Mother    Diabetes Mother    Hypertension Maternal Grandmother    Diabetes Brother    Breast cancer Maternal Aunt        great   Breast cancer Paternal Aunt        great   Colon cancer Neg Hx    Social History   Socioeconomic History   Marital status: Divorced    Spouse name: Not on file   Number of children: 2   Years of education: Not on file   Highest education level: Bachelor's degree (e.g., BA, AB, BS)  Occupational History   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  Tobacco Use   Smoking status: Never   Smokeless tobacco: Never  Substance  and Sexual Activity   Alcohol use: Yes    Alcohol/week: 0.0 standard drinks of alcohol   Drug use: No   Sexual activity: Not on file  Other Topics Concern   Not on file  Social History Narrative   Not on file   Social Determinants of Health   Financial Resource Strain: Patient Declined (03/10/2023)   Overall Financial Resource Strain (CARDIA)    Difficulty of Paying Living Expenses: Patient declined  Food Insecurity: Patient Declined (03/10/2023)   Hunger Vital Sign    Worried About Running Out of Food in the Last Year: Patient declined    Ran Out of Food in the Last Year: Patient declined  Transportation Needs: Patient Declined (03/10/2023)   PRAPARE - Administrator, Civil Service (Medical): Patient declined    Lack of Transportation (Non-Medical): Patient declined  Physical Activity: Unknown (03/10/2023)   Exercise Vital Sign    Days of Exercise per Week: 0 days    Minutes of Exercise per Session: Not on file  Stress: Stress Concern Present (03/10/2023)   [employer] of Occupational Health - Occupational Stress Questionnaire    Feeling of Stress : Rather  much  Social Connections: Unknown (03/10/2023)   Social Connection and Isolation Panel [NHANES]    Frequency of Communication with Friends and Family: Patient declined    Frequency of Social Gatherings with Friends and Family: Patient declined    Attends Religious Services: Patient declined    Database administrator or Organizations: Patient declined    Attends Engineer, structural: Not on file    Marital Status: Patient declined     Review of Systems  Constitutional:  Negative for appetite change and unexpected weight change.  HENT:  Negative for congestion, sinus pressure and sore throat.   Eyes:  Negative for pain and visual disturbance.  Respiratory:  Negative for cough, chest tightness and shortness of breath.   Cardiovascular:  Negative for chest pain, palpitations and leg swelling.   Gastrointestinal:  Negative for abdominal pain, constipation, diarrhea, nausea and vomiting.  Genitourinary:  Negative for difficulty urinating and dysuria.  Musculoskeletal:  Negative for joint swelling and myalgias.  Skin:  Negative for color change and rash.  Neurological:  Negative for dizziness and headaches.  Hematological:  Negative for adenopathy. Does not bruise/bleed easily.  Psychiatric/Behavioral:  Negative for agitation and dysphoric mood.        Objective:     BP 126/74   Pulse 85   Temp 97.9 F (36.6 C)   Resp 16   Ht 5' (1.524 m)   Wt 162 lb (73.5 kg)   SpO2 98%   BMI 31.64 kg/m  Wt Readings from Last 3 Encounters:  03/10/23 162 lb (73.5 kg)  10/11/21 161 lb 9.6 oz (73.3 kg)  08/23/21 162 lb 9.6 oz (73.8 kg)    Physical Exam Vitals reviewed.  Constitutional:      General: She is not in acute distress.    Appearance: Normal appearance. She is well-developed.  HENT:     Head: Normocephalic and atraumatic.     Right Ear: External ear normal.     Left Ear: External ear normal.  Eyes:     General: No scleral icterus.       Right eye: No discharge.        Left eye: No discharge.     Conjunctiva/sclera: Conjunctivae normal.  Neck:     Thyroid: No thyromegaly.  Cardiovascular:     Rate and Rhythm: Normal rate and regular rhythm.  Pulmonary:     Effort: No tachypnea, accessory muscle usage or respiratory distress.     Breath sounds: Normal breath sounds. No decreased breath sounds or wheezing.  Chest:  Breasts:    Right: No inverted nipple, mass, nipple discharge or tenderness (no axillary adenopathy).     Left: No inverted nipple, mass, nipple discharge or tenderness (no axilarry adenopathy).  Abdominal:     General: Bowel sounds are normal.     Palpations: Abdomen is soft.     Tenderness: There is no abdominal tenderness.  Musculoskeletal:        General: No swelling or tenderness.     Cervical back: Neck supple.  Lymphadenopathy:     Cervical:  No cervical adenopathy.  Skin:    Findings: No erythema or rash.  Neurological:     Mental Status: She is alert and oriented to person, place, and time.  Psychiatric:        Mood and Affect: Mood normal.        Behavior: Behavior normal.      Outpatient Encounter Medications as of 03/10/2023  Medication Sig  Semaglutide-Weight Management (WEGOVY) 0.25 MG/0.5ML SOAJ Inject 0.25 mg into the skin once a week.   ibuprofen (ADVIL,MOTRIN) 600 MG tablet Take 600 mg by mouth every 6 (six) hours as needed.   magnesium oxide (MAG-OX) 400 (240 Mg) MG tablet Take 1 tablet (400 mg total) by mouth daily.   Simethicone 125 MG CAPS Take by mouth as needed.   triamcinolone ointment (KENALOG) 0.1 % Apply 1 application topically 2 (two) times daily. Avoid F/G/A   [DISCONTINUED] magnesium oxide (MAG-OX) 400 (240 Mg) MG tablet Take 1 tablet (400 mg total) by mouth daily.   [DISCONTINUED] sertraline (ZOLOFT) 50 MG tablet TAKE 1/2 TABLET BY MOUTH PER DAY FOR 1 WEEK, AND THEN INCREASE TO 1 TABLET PER DAY   No facility-administered encounter medications on file as of 03/10/2023.     Lab Results  Component Value Date   WBC 5.6 05/17/2021   HGB 13.6 05/17/2021   HCT 40.7 05/17/2021   PLT 265 05/17/2021   GLUCOSE 98 10/11/2021   CHOL 250 (H) 10/11/2021   CHOL 272 (H) 10/11/2021   TRIG 81 10/11/2021   TRIG 95 10/11/2021   HDL 72 10/11/2021   LDLDIRECT 158.1 03/18/2013   LDLCALC 164 (H) 10/11/2021   ALT 18 10/11/2021   AST 18 10/11/2021   NA 142 10/11/2021   K 4.9 10/11/2021   CL 102 10/11/2021   CREATININE 0.69 10/11/2021   BUN 22 10/11/2021   CO2 22 10/11/2021   TSH 1.650 02/08/2021    No results found.     Assessment & Plan:  Routine general medical examination at a health care facility  Visit for screening mammogram -     3D Screening Mammogram, Left and Right; Future  Hypercholesterolemia Assessment & Plan: Desires not to take cholesterol medication.  Recent calcium score 0.   Continue low cholesterol diet and exercise.  Follow lipid panel.    Orders: -     CBC with Differential/Platelet -     Basic metabolic panel -     Lipid panel -     Hepatic function panel -     TSH  Health care maintenance Assessment & Plan: Physical today 03/10/23.  Colonoscopy 12/2011.  Have discussed f/u colonoscopy given family history of colon polyps. Refer to GI for f/u colonoscopy. Mammogram 02/16/21 (bilateral diagnostic) - Bir[facility] I.  Schedule mammogram. Declines pap smear.     Hyperglycemia Assessment & Plan: Found to have A1c 5.7.  Low carb diet and exercise.  Follow met b and A1c.   Orders: -     Hemoglobin A1c  Memory change -     Vitamin B12  Colon cancer screening -     Ambulatory referral to Gastroenterology  Stress Assessment & Plan: Increased stress.  Discussed.  Feels she is handling things relatively well.  Does not feel needs any further intervention. Follow.    Fatty liver Assessment & Plan: Changes on a previous scan that could be c/w fatty liver.  Follow liver function tests.    Other orders -     Magnesium Oxide -Mg Supplement; Take 1 tablet (400 mg total) by mouth daily.  Dispense: 30 tablet; Refill: 2 -     Wegovy; Inject 0.25 mg into the skin once a week.  Dispense: 2 mL; Refill: 2     Dale , MD

## 2023-03-10 NOTE — Assessment & Plan Note (Addendum)
Physical today 03/10/23.  Colonoscopy 12/2011.  Have discussed f/u colonoscopy given family history of colon polyps. Refer to GI for f/u colonoscopy. Mammogram 02/16/21 (bilateral diagnostic) - [facility] I.  Schedule mammogram. Declines pap smear.

## 2023-03-12 ENCOUNTER — Other Ambulatory Visit (HOSPITAL_COMMUNITY): Payer: Self-pay

## 2023-03-12 ENCOUNTER — Telehealth: Payer: Self-pay

## 2023-03-12 NOTE — Telephone Encounter (Signed)
Pharmacy Patient Advocate Encounter   Received notification from Physician's Office that prior authorization for [facility] is required/requested.   Insurance verification completed.   The patient is insured through [facility] .   Per test claim: PA required; PA submitted to above mentioned insurance via CoverMyMeds Key/confirmation #/EOC (Key: [id number]) Status is pending

## 2023-03-13 ENCOUNTER — Other Ambulatory Visit (HOSPITAL_COMMUNITY): Payer: Self-pay

## 2023-03-13 NOTE — Telephone Encounter (Signed)
Pt called and I read the note and she verbalized understanding

## 2023-03-13 NOTE — Telephone Encounter (Signed)
Pharmacy Patient Advocate Encounter  Received notification from [facility] that Prior Authorization for [facility] 0.25MG /0.5ML auto-injectors has been APPROVED from 03/12/2023 to 09/10/2023   PA #/Case ID/Reference #:  [id number]

## 2023-03-13 NOTE — Telephone Encounter (Signed)
Left message for patient. Please let patient know that her Reginal Lutes was approved when she returns call.

## 2023-03-16 ENCOUNTER — Encounter: Payer: Self-pay | Admitting: Internal Medicine

## 2023-03-16 NOTE — Assessment & Plan Note (Signed)
Increased stress.  Discussed.  Feels she is handling things relatively well.  Does not feel needs any further intervention. Follow.

## 2023-03-16 NOTE — Assessment & Plan Note (Signed)
Found to have A1c 5.7.  Low carb diet and exercise.  Follow met b and A1c.

## 2023-03-16 NOTE — Assessment & Plan Note (Signed)
Changes on a previous scan that could be c/w fatty liver.  Follow liver function tests.

## 2023-03-16 NOTE — Assessment & Plan Note (Signed)
Desires not to take cholesterol medication.  Recent calcium score 0.  Continue low cholesterol diet and exercise.  Follow lipid panel.   ?

## 2023-03-17 ENCOUNTER — Encounter: Payer: Self-pay | Admitting: *Deleted

## 2023-03-20 ENCOUNTER — Ambulatory Visit
Admission: RE | Admit: 2023-03-20 | Discharge: 2023-03-20 | Disposition: A | Discharge status: Home / Self Care | Payer: Managed Care, Other (non HMO) | Source: Ambulatory Visit | Attending: Internal Medicine | Admitting: Internal Medicine

## 2023-03-20 DIAGNOSIS — Z1231 Encounter for screening mammogram for malignant neoplasm of breast: Secondary | ICD-10-CM | POA: Diagnosis present

## 2023-03-26 ENCOUNTER — Telehealth: Payer: Self-pay

## 2023-03-26 DIAGNOSIS — R7989 Other specified abnormal findings of blood chemistry: Secondary | ICD-10-CM

## 2023-03-26 LAB — CBC WITH DIFFERENTIAL/PLATELET
Basophils Absolute: 0 10*3/uL (ref 0.0–0.2)
Basos: 0 %
EOS (ABSOLUTE): 0.2 10*3/uL (ref 0.0–0.4)
Eos: 4 %
Hematocrit: 41.8 % (ref 34.0–46.6)
Hemoglobin: 13.6 g/dL (ref 11.1–15.9)
Immature Grans (Abs): 0 10*3/uL (ref 0.0–0.1)
Immature Granulocytes: 1 %
Lymphocytes Absolute: 1.2 10*3/uL (ref 0.7–3.1)
Lymphs: 19 %
MCH: 29.8 pg (ref 26.6–33.0)
MCHC: 32.5 g/dL (ref 31.5–35.7)
MCV: 92 fL (ref 79–97)
Monocytes Absolute: 0.5 10*3/uL (ref 0.1–0.9)
Monocytes: 8 %
Neutrophils Absolute: 4.4 10*3/uL (ref 1.4–7.0)
Neutrophils: 68 %
Platelets: 257 10*3/uL (ref 150–450)
RBC: 4.56 x10E6/uL (ref 3.77–5.28)
RDW: 12.2 % (ref 11.7–15.4)
WBC: 6.4 10*3/uL (ref 3.4–10.8)

## 2023-03-26 LAB — TSH: TSH: 1.14 u[IU]/mL (ref 0.450–4.500)

## 2023-03-26 LAB — HEPATIC FUNCTION PANEL
ALT: 35 [IU]/L — ABNORMAL HIGH (ref 0–32)
AST: 36 [IU]/L (ref 0–40)
Albumin: 4.5 g/dL (ref 3.9–4.9)
Alkaline Phosphatase: 132 [IU]/L — ABNORMAL HIGH (ref 44–121)
Bilirubin Total: 0.5 mg/dL (ref 0.0–1.2)
Bilirubin, Direct: 0.17 mg/dL (ref 0.00–0.40)
Total Protein: 7.3 g/dL (ref 6.0–8.5)

## 2023-03-26 LAB — BASIC METABOLIC PANEL
BUN/Creatinine Ratio: 16 (ref 12–28)
BUN: 14 mg/dL (ref 8–27)
CO2: 23 mmol/L (ref 20–29)
Calcium: 10.1 mg/dL (ref 8.7–10.3)
Chloride: 101 mmol/L (ref 96–106)
Creatinine, Ser: 0.9 mg/dL (ref 0.57–1.00)
Glucose: 95 mg/dL (ref 70–99)
Potassium: 4.5 mmol/L (ref 3.5–5.2)
Sodium: 139 mmol/L (ref 134–144)
eGFR: 72 mL/min/{1.73_m2} (ref >59)

## 2023-03-26 LAB — LIPID PANEL
Chol/HDL Ratio: 3.6 ratio (ref 0.0–4.4)
Cholesterol, Total: 258 mg/dL — ABNORMAL HIGH (ref 100–199)
HDL: 72 mg/dL (ref >39)
LDL Chol Calc (NIH): 165 mg/dL — ABNORMAL HIGH (ref 0–99)
Triglycerides: 121 mg/dL (ref 0–149)
VLDL Cholesterol Cal: 21 mg/dL (ref 5–40)

## 2023-03-26 LAB — HEMOGLOBIN A1C
Est. average glucose Bld gHb Est-mCnc: 117 mg/dL
Hgb A1c MFr Bld: 5.7 % — ABNORMAL HIGH (ref 4.8–5.6)

## 2023-03-26 LAB — VITAMIN B12: Vitamin B-12: 384 pg/mL (ref 232–1245)

## 2023-03-26 NOTE — Telephone Encounter (Signed)
Liver function ordered for [employer].

## 2023-04-14 IMAGING — MG DIGITAL DIAGNOSTIC BILAT W/ TOMO W/ CAD
6 of 10 series · 6 of 30 positions shown · non-contrast
Comparison: Previous exam(s).

CLINICAL DATA: Focal pain in the lower outer right breast since
leaning over against the [place] in her car 3 weeks ago with
pressure on that area.

EXAM:
DIGITAL DIAGNOSTIC BILATERAL MAMMOGRAM WITH TOMOSYNTHESIS AND CAD;
ULTRASOUND RIGHT BREAST LIMITED
TECHNIQUE: Bilateral digital diagnostic mammography and breast tomosynthesis
was performed. The images were evaluated with computer-aided
detection.; Targeted ultrasound examination of the right breast was
performed

[R CC synth-2D (1 of 2)]
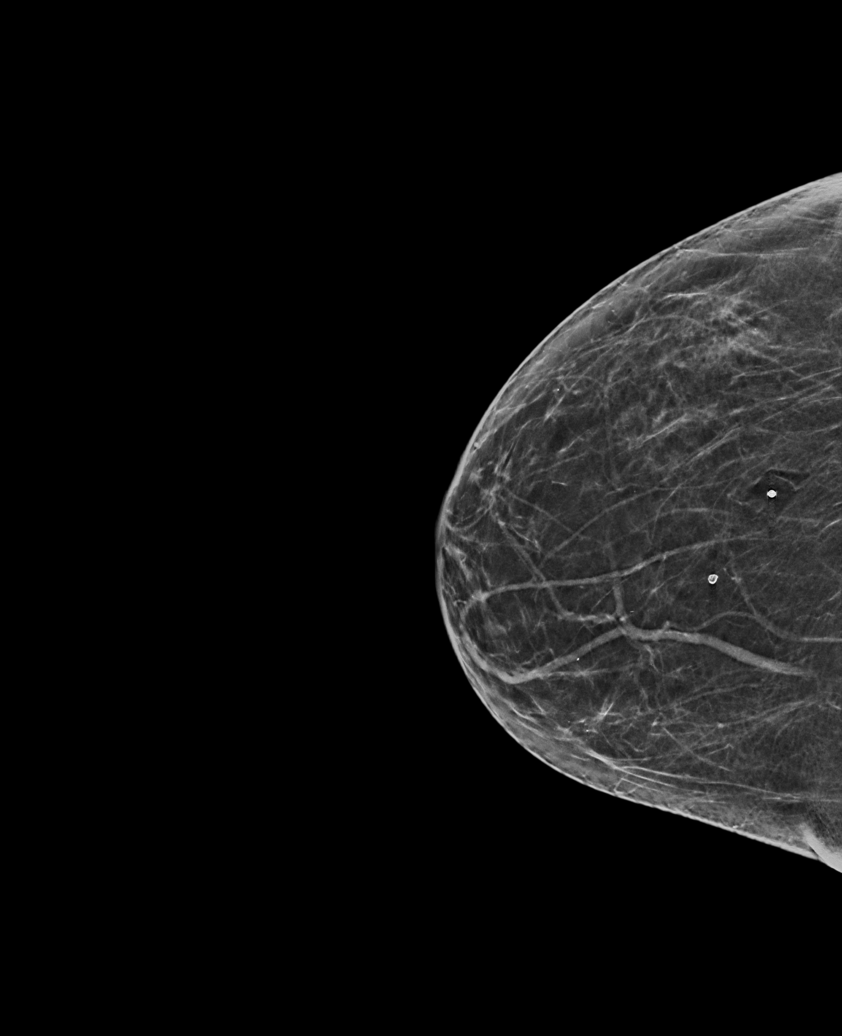

[L MLO synth-2D]
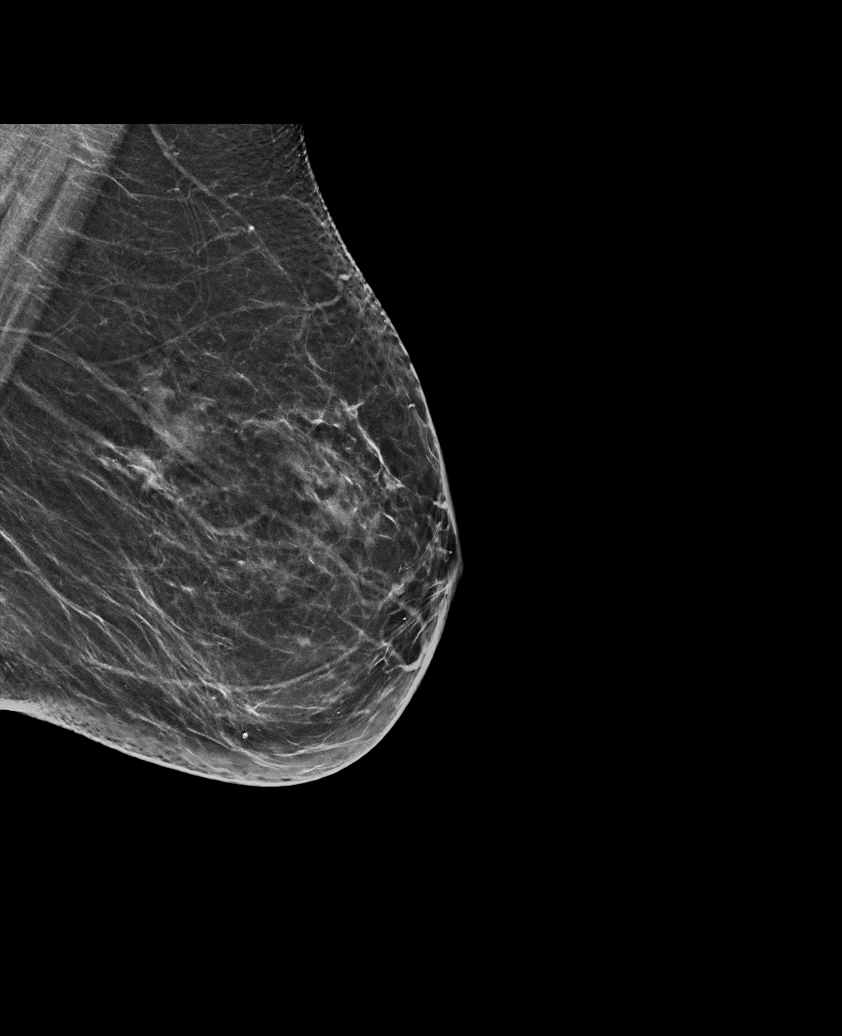

[R CC synth-2D (2 of 2)]
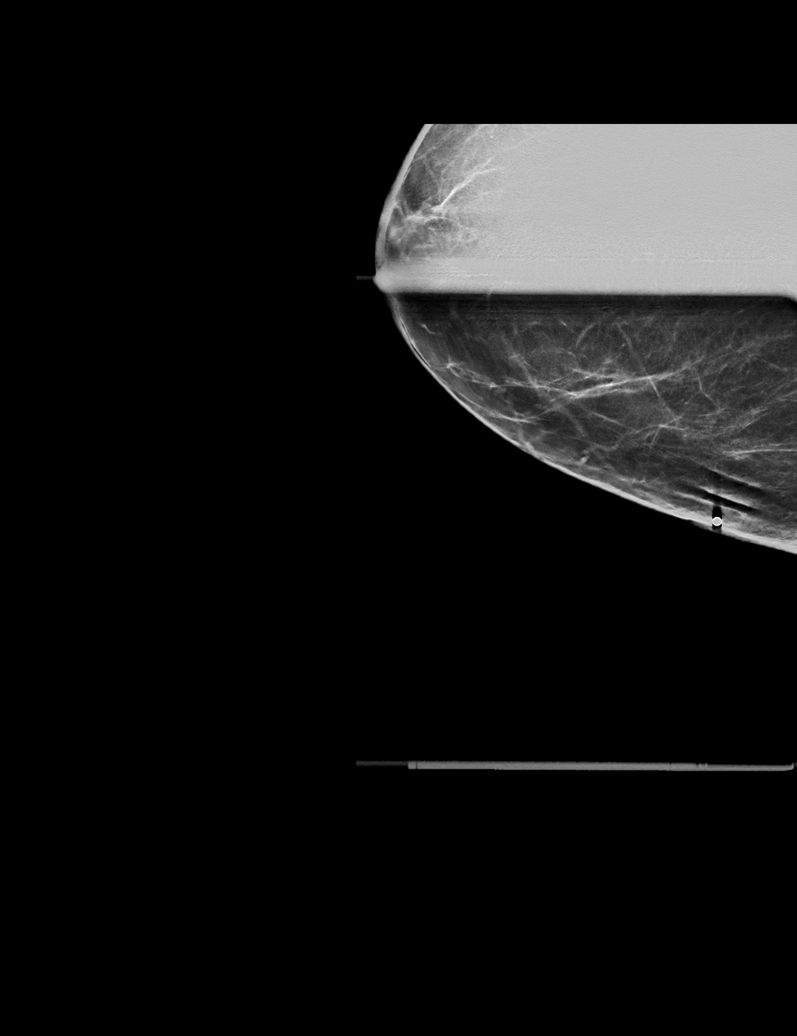

[R MLO synth-2D]
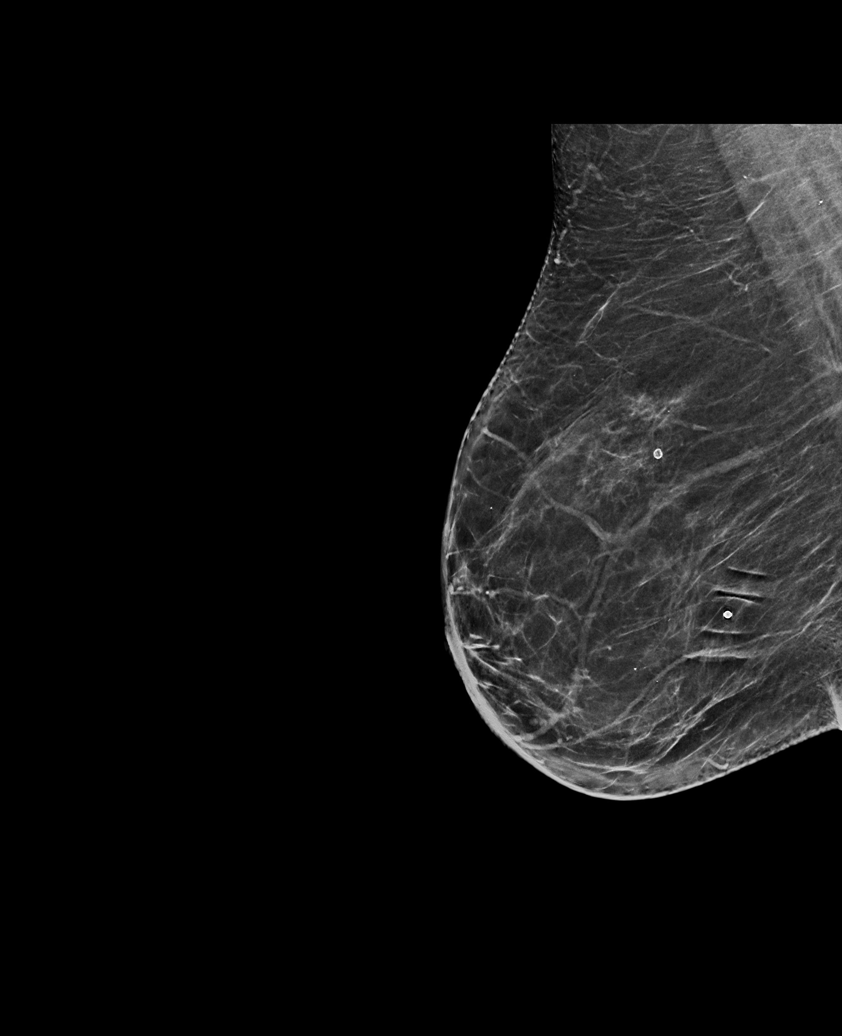

[L CC synth-2D]
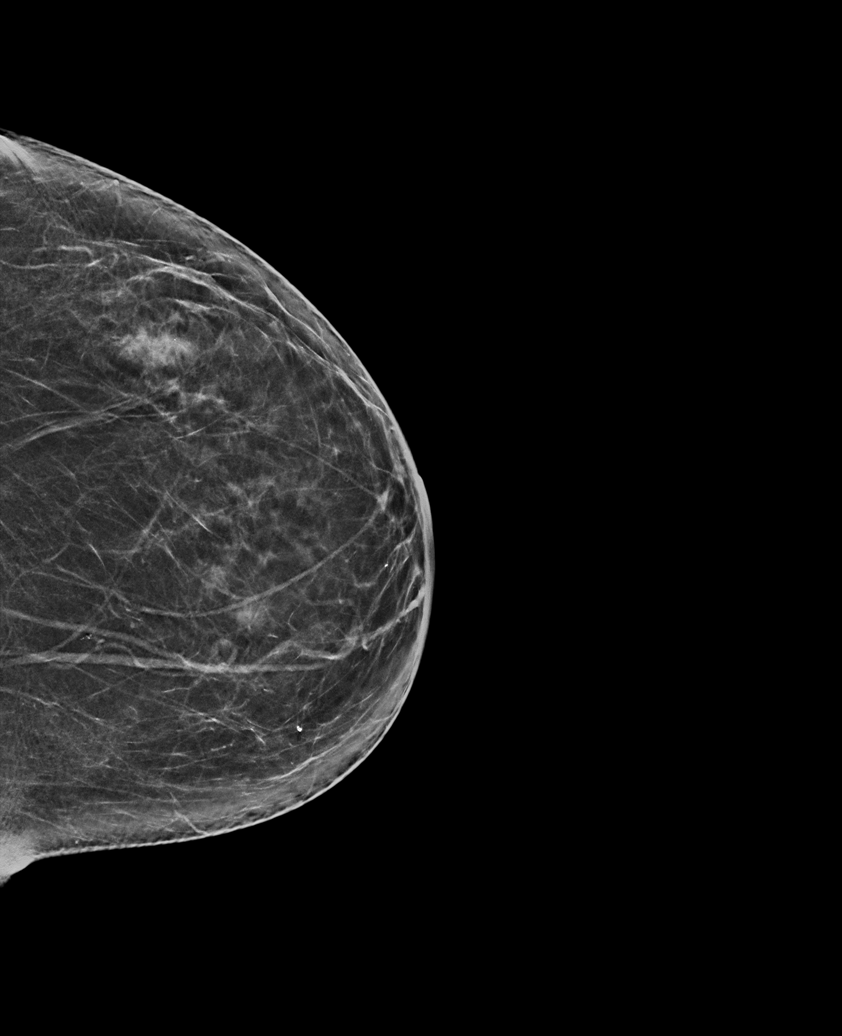

[R CC tomo · tomo slice 29/58.0]
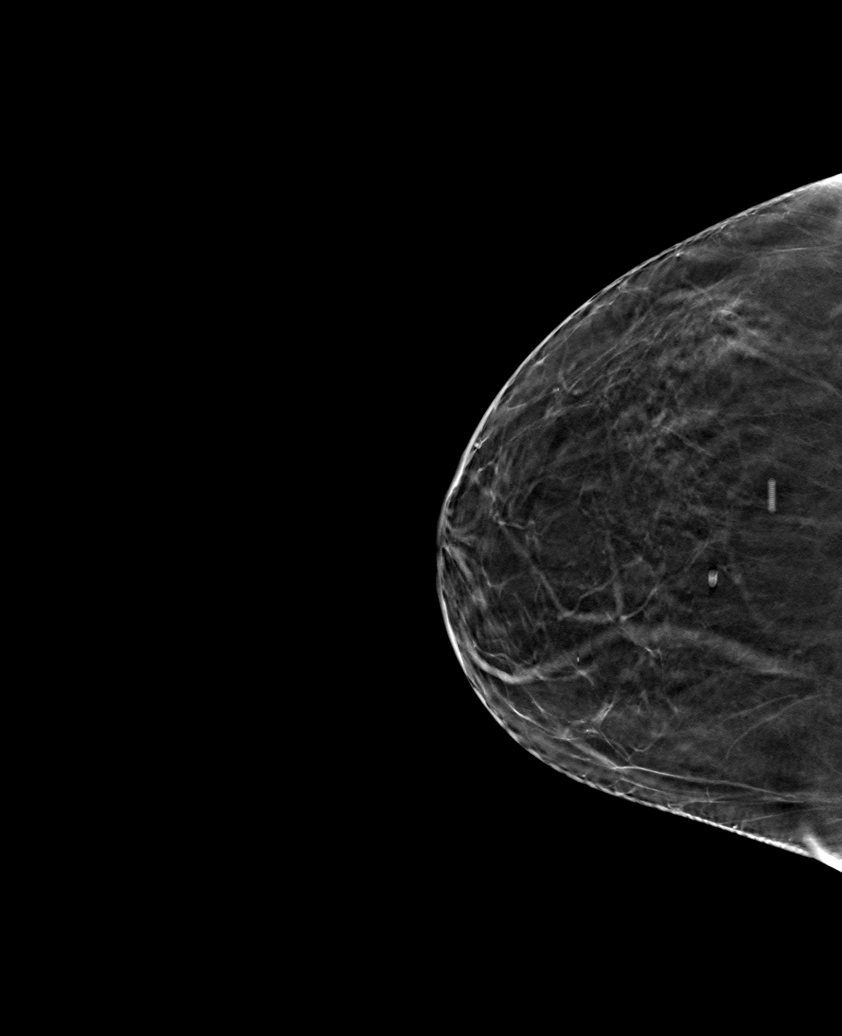

[6 of 30 positions shown; findings below may reference images not displayed]

ACR Breast Density Category b: There are scattered areas of
fibroglandular density.
FINDINGS: Stable mammographic appearance of the breasts with no interval
findings suspicious for malignancy in either breast.

On physical exam, the patient is focally tender to palpation in the
lower outer right breast inferiorly. There is no palpable mass at
that location. There are palpable ribs and intercostal muscle at
that location.

Targeted ultrasound is performed, showing normal appearing breast
tissue throughout the lower outer right breast at the location focal
pain and tenderness.
IMPRESSION: 1. No evidence of malignancy.
2. The patient's pain and tenderness on the right is compatible with
musculoskeletal pain.

RECOMMENDATION:
Bilateral screening mammogram in 1 year.

I have discussed the findings and recommendations with the patient.
If applicable, a reminder letter will be sent to the patient
regarding the next appointment.

BI-RADS CATEGORY  1: Negative.

## 2023-05-01 LAB — HEPATIC FUNCTION PANEL
ALT: 11 [IU]/L (ref 0–32)
AST: 13 [IU]/L (ref 0–40)
Albumin: 4.5 g/dL (ref 3.9–4.9)
Alkaline Phosphatase: 97 [IU]/L (ref 44–121)
Bilirubin Total: 0.5 mg/dL (ref 0.0–1.2)
Bilirubin, Direct: 0.15 mg/dL (ref 0.00–0.40)
Total Protein: 6.8 g/dL (ref 6.0–8.5)

## 2023-05-19 NOTE — Progress Notes (Signed)
 Subjective:    Patient ID: Meghan  LITTIE Valdez, female    DOB: 05/17/1960, 63 y.o.   MRN: [id number]  Patient here for  Chief Complaint  Patient presents with   Medical Management of Chronic Issues    HPI Here for a scheduled follow up. Last visit, prescribed wegovy . She reports some nausea. Discussed possible side effects of wegovy . Eating. Decreased appetite. No abdominal pain. Bowels moving. Discussed the medication and side effects. She feels overall that she is tolerating the medication. Desires to continue.    Past Medical History:  Diagnosis Date   Allergic rhinitis    Basal cell carcinoma of skin    removed 1998   Chicken pox    Chronic sinusitis    Dysplastic nevus 07/30/2022   L mid back - mild   History of migraine headaches    Hypercholesterolemia    Migraines    Past Surgical History:  Procedure Laterality Date   BREAST EXCISIONAL BIOPSY Left 1986   CESAREAN SECTION  1992   LAPAROSCOPIC CHOLECYSTECTOMY  2008   LIPOSUCTION     SEPTOPLASTY  1993   SKIN CANCER EXCISION  1998   basal cell carcinoma of the scalp   TOTAL ABDOMINAL HYSTERECTOMY W/ BILATERAL SALPINGOOPHORECTOMY     lysis of adhesions   Family History  Problem Relation Age of Onset   Hypertension Mother    Hyperlipidemia Mother    Diabetes Mother    Rheumatic fever Father    Breast cancer Maternal Aunt        great   Breast cancer Paternal Aunt        great   Hypertension Maternal Grandmother    Diabetes Brother    Colon cancer Neg Hx    Social History   Socioeconomic History   Marital status: Divorced    Spouse name: Not on file   Number of children: 2   Years of education: Not on file   Highest education level: Bachelor's degree (e.g., BA, AB, BS)  Occupational History   Occupation: retired - med best boy  Tobacco Use   Smoking status: Never   Smokeless tobacco: Never  Substance and Sexual Activity   Alcohol use: Yes    Alcohol/week: 0.0 standard drinks of alcohol   Drug use: No    Sexual activity: Not on file  Other Topics Concern   Not on file  Social History Narrative   Not on file   Social Drivers of Health   Financial Resource Strain: Low Risk  (05/16/2023)   Overall Financial Resource Strain (CARDIA)    Difficulty of Paying Living Expenses: Not hard at all  Food Insecurity: No Food Insecurity (05/16/2023)   Hunger Vital Sign    Worried About Running Out of Food in the Last Year: Never true    Ran Out of Food in the Last Year: Never true  Transportation Needs: No Transportation Needs (05/16/2023)   PRAPARE - Administrator, Civil Service (Medical): No    Lack of Transportation (Non-Medical): No  Physical Activity: Unknown (05/16/2023)   Exercise Vital Sign    Days of Exercise per Week: 0 days    Minutes of Exercise per Session: Not on file  Stress: Stress Concern Present (05/16/2023)   [employer] of Occupational Health - Occupational Stress Questionnaire    Feeling of Stress : To some extent  Social Connections: Moderately Isolated (05/16/2023)   Social Connection and Isolation Panel [NHANES]    Frequency of Communication with Friends and  Family: Twice a week    Frequency of Social Gatherings with Friends and Family: Once a week    Attends Religious Services: More than 4 times per year    Active Member of [employer] or Organizations: No    Attends Engineer, Structural: Not on file    Marital Status: Divorced     Review of Systems  Constitutional:        Decreased appetite - with medication. Reports she is eating. Has lost weight.   HENT:  Negative for congestion and sinus pressure.   Respiratory:  Negative for cough, chest tightness and shortness of breath.   Cardiovascular:  Negative for chest pain and palpitations.  Gastrointestinal:  Negative for abdominal pain and vomiting.  Genitourinary:  Negative for difficulty urinating and dysuria.  Musculoskeletal:  Negative for joint swelling and myalgias.  Skin:  Negative for color  change and rash.  Neurological:  Negative for dizziness and headaches.  Psychiatric/Behavioral:  Negative for agitation and dysphoric mood.        Objective:     BP 128/72   Pulse 87   Temp 98.2 F (36.8 C)   Resp 16   Ht 5' (1.524 m)   Wt 151 lb (68.5 kg)   SpO2 99%   BMI 29.49 kg/m  Wt Readings from Last 3 Encounters:  05/20/23 151 lb (68.5 kg)  03/10/23 162 lb (73.5 kg)  10/11/21 161 lb 9.6 oz (73.3 kg)    Physical Exam   Outpatient Encounter Medications as of 05/20/2023  Medication Sig   ibuprofen (ADVIL,MOTRIN) 600 MG tablet Take 600 mg by mouth every 6 (six) hours as needed.   magnesium  oxide (MAG-OX) 400 (240 Mg) MG tablet Take 1 tablet (400 mg total) by mouth daily.   Semaglutide -Weight Management (WEGOVY ) 0.25 MG/0.5ML SOAJ Inject 0.25 mg into the skin once a week.   Simethicone 125 MG CAPS Take by mouth as needed.   triamcinolone  ointment (KENALOG ) 0.1 % Apply 1 application topically 2 (two) times daily. Avoid F/G/A   No facility-administered encounter medications on file as of 05/20/2023.     Lab Results  Component Value Date   WBC 6.4 03/25/2023   HGB 13.6 03/25/2023   HCT 41.8 03/25/2023   PLT 257 03/25/2023   GLUCOSE 95 03/25/2023   CHOL 258 (H) 03/25/2023   TRIG 121 03/25/2023   HDL 72 03/25/2023   LDLDIRECT 158.1 03/18/2013   LDLCALC 165 (H) 03/25/2023   ALT 11 04/30/2023   AST 13 04/30/2023   NA 139 03/25/2023   K 4.5 03/25/2023   CL 101 03/25/2023   CREATININE 0.90 03/25/2023   BUN 14 03/25/2023   CO2 23 03/25/2023   TSH 1.140 03/25/2023   HGBA1C 5.7 (H) 03/25/2023    MM 3D SCREENING MAMMOGRAM BILATERAL BREAST Result Date: 03/25/2023 CLINICAL DATA:  Screening. EXAM: DIGITAL SCREENING BILATERAL MAMMOGRAM WITH TOMOSYNTHESIS AND CAD TECHNIQUE: Bilateral screening digital craniocaudal and mediolateral oblique mammograms were obtained. Bilateral screening digital breast tomosynthesis was performed. The images were evaluated with computer-aided  detection. COMPARISON:  Previous exam(s). ACR Breast Density Category b: There are scattered areas of fibroglandular density. FINDINGS: There are no findings suspicious for malignancy. IMPRESSION: No mammographic evidence of malignancy. A result letter of this screening mammogram will be mailed directly to the patient. RECOMMENDATION: Screening mammogram in one year. (Code:SM-B-01Y) BI-RADS CATEGORY  1: Negative. Electronically Signed   By: Inocente Ast M.D.   On: 03/25/2023 09:20       Assessment &  Plan:  Encounter for weight loss counseling Assessment & Plan: Discussed wegovy .. She is losing weight. Some nausea. Discussed. She does not want to stop the medication. Will continue current dose. Follow.  Monitor tolerance.    Fatty liver Assessment & Plan: Changes on a previous scan that could be c/w fatty liver.  Follow liver function tests.    Hypercholesterolemia Assessment & Plan: Desires not to take cholesterol medication.  Recent calcium score 0.  Continue low cholesterol diet and exercise.  Follow lipid panel.     Hyperglycemia Assessment & Plan: Found to have A1c 5.7.  Low carb diet and exercise.  Follow met b and A1c.    Stress Assessment & Plan: Increased stress.  Discussed.  Feels she is handling things relatively well.  Does not feel needs any further intervention. Follow.       Allena Hamilton, MD

## 2023-05-20 ENCOUNTER — Ambulatory Visit: Payer: Managed Care, Other (non HMO) | Admitting: Internal Medicine

## 2023-05-20 VITALS — BP 128/72 | HR 87 | Temp 98.2°F | Resp 16 | Ht 60.0 in | Wt 151.0 lb

## 2023-05-20 DIAGNOSIS — Z713 Dietary counseling and surveillance: Secondary | ICD-10-CM | POA: Diagnosis not present

## 2023-05-20 DIAGNOSIS — E7800 Pure hypercholesterolemia, unspecified: Secondary | ICD-10-CM | POA: Diagnosis not present

## 2023-05-20 DIAGNOSIS — R739 Hyperglycemia, unspecified: Secondary | ICD-10-CM

## 2023-05-20 DIAGNOSIS — K760 Fatty (change of) liver, not elsewhere classified: Secondary | ICD-10-CM | POA: Diagnosis not present

## 2023-05-20 DIAGNOSIS — F439 Reaction to severe stress, unspecified: Secondary | ICD-10-CM

## 2023-05-24 ENCOUNTER — Encounter: Payer: Self-pay | Admitting: Internal Medicine

## 2023-05-24 DIAGNOSIS — Z713 Dietary counseling and surveillance: Secondary | ICD-10-CM | POA: Insufficient documentation

## 2023-05-24 NOTE — Assessment & Plan Note (Signed)
Desires not to take cholesterol medication.  Recent calcium score 0.  Continue low cholesterol diet and exercise.  Follow lipid panel.   ?

## 2023-05-24 NOTE — Assessment & Plan Note (Signed)
 Found to have A1c 5.7.  Low carb diet and exercise.  Follow met b and A1c.

## 2023-05-24 NOTE — Assessment & Plan Note (Signed)
 Increased stress.  Discussed.  Feels she is handling things relatively well.  Does not feel needs any further intervention. Follow.

## 2023-05-24 NOTE — Assessment & Plan Note (Signed)
 Changes on a previous scan that could be c/w fatty liver.  Follow liver function tests.

## 2023-05-24 NOTE — Assessment & Plan Note (Signed)
 Discussed wegovy.. She is losing weight. Some nausea. Discussed. She does not want to stop the medication. Will continue current dose. Follow.  Monitor tolerance.

## 2023-06-22 ENCOUNTER — Encounter: Payer: Self-pay | Admitting: Internal Medicine

## 2023-06-24 NOTE — Telephone Encounter (Signed)
Ok to switch her to the magnesium glycinate? If so, shouldn't she get this OTC?

## 2023-06-24 NOTE — Telephone Encounter (Signed)
Ok to change to magnesium glycinate and this is over the counter.

## 2023-06-25 ENCOUNTER — Other Ambulatory Visit: Payer: Self-pay | Admitting: Internal Medicine

## 2023-06-26 NOTE — Telephone Encounter (Signed)
Ok to refill wegovy. Magnesium glycinate is otx

## 2023-06-26 NOTE — Telephone Encounter (Signed)
Can we send in rx for magnesium glycinate? If so, just need to know dose and directions and I will send in when I send in wegovy refill.

## 2023-07-17 ENCOUNTER — Ambulatory Visit: Payer: Managed Care, Other (non HMO) | Admitting: Internal Medicine

## 2023-07-17 VITALS — BP 118/70 | HR 87 | Temp 98.2°F | Resp 16 | Ht 60.0 in | Wt 147.6 lb

## 2023-07-17 DIAGNOSIS — F439 Reaction to severe stress, unspecified: Secondary | ICD-10-CM

## 2023-07-17 DIAGNOSIS — K760 Fatty (change of) liver, not elsewhere classified: Secondary | ICD-10-CM | POA: Diagnosis not present

## 2023-07-17 DIAGNOSIS — E7800 Pure hypercholesterolemia, unspecified: Secondary | ICD-10-CM | POA: Diagnosis not present

## 2023-07-17 DIAGNOSIS — Z713 Dietary counseling and surveillance: Secondary | ICD-10-CM

## 2023-07-17 DIAGNOSIS — R739 Hyperglycemia, unspecified: Secondary | ICD-10-CM | POA: Diagnosis not present

## 2023-07-17 MED ORDER — WEGOVY 0.5 MG/0.5ML ~~LOC~~ SOAJ: 0.5000 mg | SUBCUTANEOUS | 2 refills | Status: DC

## 2023-07-17 NOTE — Progress Notes (Signed)
 Subjective:    Patient ID: Meghan Valdez, female    DOB: Dec 30, 1960, 63 y.o.   MRN: [id number]  Patient here for  Chief Complaint  Patient presents with   Medical Management of Chronic Issues    HPI Here for a scheduled follow up. On wegovy for weight loss. Concern last visit regarding nausea. Reports this has improved. Tolerating wegovy. Feels her appetite is not as suppressed. Would like to increase the dose. No swallowing problems. Discussed increased water intake and miralax - to help with constipation. Handling stress with family health issues.    Past Medical History:  Diagnosis Date   Allergic rhinitis    Basal cell carcinoma of skin    removed 1998   Chicken pox    Chronic sinusitis    Dysplastic nevus 07/30/2022   L mid back - mild   History of migraine headaches    Hypercholesterolemia    Migraines    Past Surgical History:  Procedure Laterality Date   BREAST EXCISIONAL BIOPSY Left 1986   CESAREAN SECTION  1992   LAPAROSCOPIC CHOLECYSTECTOMY  2008   LIPOSUCTION     SEPTOPLASTY  1993   SKIN CANCER EXCISION  1998   basal cell carcinoma of the scalp   TOTAL ABDOMINAL HYSTERECTOMY W/ BILATERAL SALPINGOOPHORECTOMY     lysis of adhesions   Family History  Problem Relation Age of Onset   Hypertension Mother    Hyperlipidemia Mother    Diabetes Mother    Rheumatic fever Father    Breast cancer Maternal Aunt        great   Breast cancer Paternal Aunt        great   Hypertension Maternal Grandmother    Diabetes Brother    Colon cancer Neg Hx    Social History   Socioeconomic History   Marital status: Divorced    Spouse name: Not on file   Number of children: 2   Years of education: Not on file   Highest education level: Bachelor's degree (e.g., BA, AB, BS)  Occupational History   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  Tobacco Use   Smoking status: Never   Smokeless tobacco: Never  Substance and Sexual Activity   Alcohol use: Yes    Alcohol/week: 0.0  standard drinks of alcohol   Drug use: No   Sexual activity: Not on file  Other Topics Concern   Not on file  Social History Narrative   Not on file   Social Drivers of Health   Financial Resource Strain: Low Risk  (05/16/2023)   Overall Financial Resource Strain (CARDIA)    Difficulty of Paying Living Expenses: Not hard at all  Food Insecurity: No Food Insecurity (05/16/2023)   Hunger Vital Sign    Worried About Running Out of Food in the Last Year: Never true    Ran Out of Food in the Last Year: Never true  Transportation Needs: No Transportation Needs (05/16/2023)   PRAPARE - Administrator, Civil Service (Medical): No    Lack of Transportation (Non-Medical): No  Physical Activity: Unknown (05/16/2023)   Exercise Vital Sign    Days of Exercise per Week: 0 days    Minutes of Exercise per Session: Not on file  Stress: Stress Concern Present (05/16/2023)   [employer] of Occupational Health - Occupational Stress Questionnaire    Feeling of Stress : To some extent  Social Connections: Moderately Isolated (05/16/2023)   Social Connection and Isolation Panel [NHANES]  Frequency of Communication with Friends and Family: Twice a week    Frequency of Social Gatherings with Friends and Family: Once a week    Attends Religious Services: More than 4 times per year    Active Member of [employer] or Organizations: No    Attends Engineer, structural: Not on file    Marital Status: Divorced     Review of Systems  Constitutional:  Negative for appetite change and unexpected weight change.  HENT:  Negative for congestion and sinus pressure.   Respiratory:  Negative for cough, chest tightness and shortness of breath.   Cardiovascular:  Negative for chest pain, palpitations and leg swelling.  Gastrointestinal:  Negative for abdominal pain, diarrhea, nausea and vomiting.       Discussed constipation.   Genitourinary:  Negative for difficulty urinating and dysuria.   Musculoskeletal:  Negative for joint swelling and myalgias.  Skin:  Negative for color change and rash.  Neurological:  Negative for dizziness and headaches.  Psychiatric/Behavioral:  Negative for agitation and dysphoric mood.        Objective:     BP 118/70   Pulse 87   Temp 98.2 F (36.8 C)   Resp 16   Ht 5' (1.524 m)   Wt 147 lb 9.6 oz (67 kg)   SpO2 99%   BMI 28.83 kg/m  Wt Readings from Last 3 Encounters:  07/17/23 147 lb 9.6 oz (67 kg)  05/20/23 151 lb (68.5 kg)  03/10/23 162 lb (73.5 kg)    Physical Exam Vitals reviewed.  Constitutional:      General: She is not in acute distress.    Appearance: Normal appearance.  HENT:     Head: Normocephalic and atraumatic.     Right Ear: External ear normal.     Left Ear: External ear normal.     Mouth/Throat:     Pharynx: No oropharyngeal exudate or posterior oropharyngeal erythema.  Eyes:     General: No scleral icterus.       Right eye: No discharge.        Left eye: No discharge.     Conjunctiva/sclera: Conjunctivae normal.  Neck:     Thyroid: No thyromegaly.  Cardiovascular:     Rate and Rhythm: Normal rate and regular rhythm.  Pulmonary:     Effort: No respiratory distress.     Breath sounds: Normal breath sounds. No wheezing.  Abdominal:     General: Bowel sounds are normal.     Palpations: Abdomen is soft.     Tenderness: There is no abdominal tenderness.  Musculoskeletal:        General: No swelling or tenderness.     Cervical back: Neck supple. No tenderness.  Lymphadenopathy:     Cervical: No cervical adenopathy.  Skin:    Findings: No erythema or rash.  Neurological:     Mental Status: She is alert.  Psychiatric:        Mood and Affect: Mood normal.        Behavior: Behavior normal.         Outpatient Encounter Medications as of 07/17/2023  Medication Sig   Semaglutide-Weight Management (WEGOVY) 0.5 MG/0.5ML SOAJ Inject 0.5 mg into the skin once a week.   ibuprofen (ADVIL,MOTRIN) 600 MG  tablet Take 600 mg by mouth every 6 (six) hours as needed.   magnesium oxide (MAG-OX) 400 (240 Mg) MG tablet Take 1 tablet (400 mg total) by mouth daily.   Simethicone 125 MG CAPS  Take by mouth as needed.   triamcinolone ointment (KENALOG) 0.1 % Apply 1 application topically 2 (two) times daily. Avoid F/G/A   [DISCONTINUED] WEGOVY 0.25 MG/0.5ML SOAJ INJECT THE CONTENTS OF ONE PEN UNDER THE SKIN ONCE WEEKLY ON THE SAME DAY EACH WEEK   No facility-administered encounter medications on file as of 07/17/2023.     Lab Results  Component Value Date   WBC 6.4 03/25/2023   HGB 13.6 03/25/2023   HCT 41.8 03/25/2023   PLT 257 03/25/2023   GLUCOSE 95 03/25/2023   CHOL 258 (H) 03/25/2023   TRIG 121 03/25/2023   HDL 72 03/25/2023   LDLDIRECT 158.1 03/18/2013   LDLCALC 165 (H) 03/25/2023   ALT 11 04/30/2023   AST 13 04/30/2023   NA 139 03/25/2023   K 4.5 03/25/2023   CL 101 03/25/2023   CREATININE 0.90 03/25/2023   BUN 14 03/25/2023   CO2 23 03/25/2023   TSH 1.140 03/25/2023   HGBA1C 5.7 (H) 03/25/2023    MM 3D SCREENING MAMMOGRAM BILATERAL BREAST Result Date: 03/25/2023 CLINICAL DATA:  Screening. EXAM: DIGITAL SCREENING BILATERAL MAMMOGRAM WITH TOMOSYNTHESIS AND CAD TECHNIQUE: Bilateral screening digital craniocaudal and mediolateral oblique mammograms were obtained. Bilateral screening digital breast tomosynthesis was performed. The images were evaluated with computer-aided detection. COMPARISON:  Previous exam(s). ACR Breast Density Category b: There are scattered areas of fibroglandular density. FINDINGS: There are no findings suspicious for malignancy. IMPRESSION: No mammographic evidence of malignancy. A result letter of this screening mammogram will be mailed directly to the patient. RECOMMENDATION: Screening mammogram in one year. (Code:SM-B-01Y) BI-RADS CATEGORY  1: Negative. Electronically Signed   By: Emmaline Kluver M.D.   On: 03/25/2023 09:20       Assessment & Plan:   Hypercholesterolemia Assessment & Plan: Recent calcium score 0. Continue diet and exercise. Desires not to take cholesterol medicaiton. Follow lipid panel.   Orders: -     Basic metabolic panel -     Hepatic function panel -     Lipid panel  Hyperglycemia Assessment & Plan: Low carb diet and exercise. Follow met b and A1c.   Orders: -     Basic metabolic panel -     Hemoglobin A1c  Encounter for weight loss counseling Assessment & Plan: Discussed wegovy. Is losing weight. Has been watching her diet. Discussed diet and exercise. The previous nausea has resolved. Discussed constipation - increased water intake, miralax. Wants to increase dose of wegovy. Increase to .5mg  weekly. Follow.    Fatty liver Assessment & Plan: Changes noted on previous scan. Continue diet and exercise.    Stress Assessment & Plan: Feels she is handling things relatively well. Increased stress as outlined. Follow.    Other orders -     Wegovy; Inject 0.5 mg into the skin once a week.  Dispense: 2 mL; Refill: 2     Dale Hatfield, MD

## 2023-07-20 ENCOUNTER — Encounter: Payer: Self-pay | Admitting: Internal Medicine

## 2023-07-20 NOTE — Assessment & Plan Note (Signed)
 Changes noted on previous scan. Continue diet and exercise.

## 2023-07-20 NOTE — Assessment & Plan Note (Signed)
 Low-carb diet and exercise.  Follow met b and A1c.

## 2023-07-20 NOTE — Assessment & Plan Note (Signed)
 Recent calcium score 0. Continue diet and exercise. Desires not to take cholesterol medicaiton. Follow lipid panel.

## 2023-07-20 NOTE — Assessment & Plan Note (Signed)
 Feels she is handling things relatively well. Increased stress as outlined. Follow.

## 2023-07-20 NOTE — Assessment & Plan Note (Signed)
 Discussed wegovy. Is losing weight. Has been watching her diet. Discussed diet and exercise. The previous nausea has resolved. Discussed constipation - increased water intake, miralax. Wants to increase dose of wegovy. Increase to .5mg  weekly. Follow.

## 2023-07-31 ENCOUNTER — Ambulatory Visit (INDEPENDENT_AMBULATORY_CARE_PROVIDER_SITE_OTHER): Payer: Managed Care, Other (non HMO) | Admitting: Dermatology

## 2023-07-31 ENCOUNTER — Encounter: Payer: Self-pay | Admitting: Dermatology

## 2023-07-31 DIAGNOSIS — Z1283 Encounter for screening for malignant neoplasm of skin: Secondary | ICD-10-CM | POA: Diagnosis not present

## 2023-07-31 DIAGNOSIS — L300 Nummular dermatitis: Secondary | ICD-10-CM

## 2023-07-31 DIAGNOSIS — L249 Irritant contact dermatitis, unspecified cause: Secondary | ICD-10-CM

## 2023-07-31 DIAGNOSIS — D229 Melanocytic nevi, unspecified: Secondary | ICD-10-CM

## 2023-07-31 DIAGNOSIS — L814 Other melanin hyperpigmentation: Secondary | ICD-10-CM

## 2023-07-31 DIAGNOSIS — L578 Other skin changes due to chronic exposure to nonionizing radiation: Secondary | ICD-10-CM | POA: Diagnosis not present

## 2023-07-31 DIAGNOSIS — L24A9 Irritant contact dermatitis due friction or contact with other specified body fluids: Secondary | ICD-10-CM

## 2023-07-31 DIAGNOSIS — D1801 Hemangioma of skin and subcutaneous tissue: Secondary | ICD-10-CM

## 2023-07-31 DIAGNOSIS — Z86018 Personal history of other benign neoplasm: Secondary | ICD-10-CM

## 2023-07-31 DIAGNOSIS — Z85828 Personal history of other malignant neoplasm of skin: Secondary | ICD-10-CM

## 2023-07-31 DIAGNOSIS — W908XXA Exposure to other nonionizing radiation, initial encounter: Secondary | ICD-10-CM | POA: Diagnosis not present

## 2023-07-31 DIAGNOSIS — L821 Other seborrheic keratosis: Secondary | ICD-10-CM

## 2023-07-31 MED ORDER — TRIAMCINOLONE ACETONIDE 0.1 % EX OINT
1.0000 | TOPICAL_OINTMENT | Freq: Two times a day (BID) | CUTANEOUS | 4 refills | Status: AC | PRN
Start: 2023-07-31 — End: ?

## 2023-07-31 NOTE — Progress Notes (Signed)
 Follow-Up Visit   Subjective  Meghan Valdez is a 63 y.o. female who presents for the following: Skin Cancer Screening and Full Body Skin Exam. Patient with history of BCC and dysplastic nevus.  The patient presents for Total-Body Skin Exam (TBSE) for skin cancer screening and mole check. The patient has spots, moles and lesions to be evaluated, some may be new or changing and the patient may have concern these could be cancer.   The following portions of the chart were reviewed this encounter and updated as appropriate: medications, allergies, medical history  Review of Systems:  No other skin or systemic complaints except as noted in HPI or Assessment and Plan.  Objective  Well appearing patient in no apparent distress; mood and affect are within normal limits.  A full examination was performed including scalp, head, eyes, ears, nose, lips, neck, chest, axillae, abdomen, back, buttocks, bilateral upper extremities, bilateral lower extremities, hands, feet, fingers, toes, fingernails, and toenails. All findings within normal limits unless otherwise noted below.   Exam of toenails limited by presence of nail polish.   Relevant physical exam findings are noted in the Assessment and Plan.    Assessment & Plan   SKIN CANCER SCREENING PERFORMED TODAY.  ACTINIC DAMAGE - Chronic condition, secondary to cumulative UV/sun exposure - diffuse scaly erythematous macules with underlying dyspigmentation - Recommend daily broad spectrum sunscreen SPF 30+ to sun-exposed areas, reapply every 2 hours as needed.  - Staying in the shade or wearing long sleeves, sun glasses (UVA+UVB protection) and wide brim hats (4-inch brim around the entire circumference of the hat) are also recommended for sun protection.  - Call for new or changing lesions.  LENTIGINES, SEBORRHEIC KERATOSES, HEMANGIOMAS - Benign normal skin lesions - Benign-appearing - Call for any changes  SEBORRHEIC KERATOSIS -  Stuck-on, waxy, tan-brown papules and/or plaques  - Benign-appearing - Discussed benign etiology and prognosis. - Observe - Call for any changes  MELANOCYTIC NEVI - Tan-brown and/or pink-flesh-colored symmetric macules and papules - Benign appearing on exam today - Observation - Call clinic for new or changing moles - Recommend daily use of broad spectrum spf 30+ sunscreen to sun-exposed areas.   HISTORY OF BASAL CELL CARCINOMA OF THE SKIN Scalp- removed 1998 - No evidence of recurrence today - Recommend regular full body skin exams - Recommend daily broad spectrum sunscreen SPF 30+ to sun-exposed areas, reapply every 2 hours as needed.  - Call if any new or changing lesions are noted between office visits  HISTORY OF DYSPLASTIC NEVUS L mid back-mild 07/30/2022 No evidence of recurrence today Recommend regular full body skin exams Recommend daily broad spectrum sunscreen SPF 30+ to sun-exposed areas, reapply every 2 hours as needed.  Call if any new or changing lesions are noted between office visits  Nummular Dermatitis Exam: scattered pink scaly patches at bilateral lower legs    Chronic and persistent condition with duration or expected duration over one year. Condition is bothersome/symptomatic for patient. Currently flared.   Nummular dermatitis (eczema) is a chronic, relapsing, itchy rash that can significantly affect quality of life. It is often associated with dry skin and flares in the wintertime, and may require treatment with prescription topical anti-inflammatory medications, in addition to gentle skin care.  If there is associated atopic dermatitis and topicals are not working, then biologic injections may be necessary to clear rash and control symptoms.  Treatment Plan: Use TMC 0.1% ointment, apply to aa, bilateral lower legs BID prn. Avoid applying  to face, groin, and axilla. Use as directed. Long-term use can cause thinning of the skin.  Topical steroids (such  as triamcinolone, fluocinolone, fluocinonide, mometasone, clobetasol, halobetasol, betamethasone, hydrocortisone) can cause thinning and lightening of the skin if they are used for too long in the same area. Your physician has selected the right strength medicine for your problem and area affected on the body. Please use your medication only as directed by your physician to prevent side effects.    Recommend mild soap and moisturizing cream 1-2 times daily.  Gentle skin care handout provided.     Irritant contact from mucus on right nare Exam: erythematous macule with focal scale at nare/cutaneous lip Protect with Vaseline or Aquaphor daily until improved Ddx periorificial   MULTIPLE BENIGN NEVI   LENTIGINES   ACTINIC ELASTOSIS   SEBORRHEIC KERATOSES   CHERRY ANGIOMA   NUMMULAR ECZEMA   IRRITANT CONTACT DERMATITIS, UNSPECIFIED TRIGGER   Return in about 1 year (around 07/30/2024) for Hx BCC, Hx Dysplastic Nevi, w/ Dr. Katrinka Blazing, TBSE.  I, Soundra Pilon, CMA, am acting as scribe for Elie Goody, MD .   Documentation: I have reviewed the above documentation for accuracy and completeness, and I agree with the above.  Elie Goody, MD

## 2023-07-31 NOTE — Patient Instructions (Addendum)

## 2023-09-12 LAB — BASIC METABOLIC PANEL WITH GFR
BUN/Creatinine Ratio: 24 (ref 12–28)
BUN: 18 mg/dL (ref 8–27)
CO2: 22 mmol/L (ref 20–29)
Calcium: 9.8 mg/dL (ref 8.7–10.3)
Chloride: 100 mmol/L (ref 96–106)
Creatinine, Ser: 0.76 mg/dL (ref 0.57–1.00)
Glucose: 87 mg/dL (ref 70–99)
Potassium: 4.9 mmol/L (ref 3.5–5.2)
Sodium: 140 mmol/L (ref 134–144)
eGFR: 88 mL/min/{1.73_m2} (ref >59)

## 2023-09-12 LAB — HEPATIC FUNCTION PANEL
ALT: 10 IU/L (ref 0–32)
AST: 16 IU/L (ref 0–40)
Albumin: 4.5 g/dL (ref 3.9–4.9)
Alkaline Phosphatase: 99 IU/L (ref 44–121)
Bilirubin Total: 0.6 mg/dL (ref 0.0–1.2)
Bilirubin, Direct: 0.22 mg/dL (ref 0.00–0.40)
Total Protein: 6.9 g/dL (ref 6.0–8.5)

## 2023-09-12 LAB — HEMOGLOBIN A1C
Est. average glucose Bld gHb Est-mCnc: 108 mg/dL
Hgb A1c MFr Bld: 5.4 % (ref 4.8–5.6)

## 2023-09-12 LAB — LIPID PANEL
Chol/HDL Ratio: 4.1 ratio (ref 0.0–4.4)
Cholesterol, Total: 223 mg/dL — ABNORMAL HIGH (ref 100–199)
HDL: 55 mg/dL (ref >39)
LDL Chol Calc (NIH): 153 mg/dL — ABNORMAL HIGH (ref 0–99)
Triglycerides: 86 mg/dL (ref 0–149)
VLDL Cholesterol Cal: 15 mg/dL (ref 5–40)

## 2023-09-16 ENCOUNTER — Encounter: Payer: Self-pay | Admitting: Internal Medicine

## 2023-09-16 ENCOUNTER — Ambulatory Visit: Admitting: Internal Medicine

## 2023-09-16 VITALS — BP 112/68 | HR 83 | Temp 98.2°F | Resp 16 | Ht 59.75 in | Wt 135.0 lb

## 2023-09-16 DIAGNOSIS — K760 Fatty (change of) liver, not elsewhere classified: Secondary | ICD-10-CM

## 2023-09-16 DIAGNOSIS — R739 Hyperglycemia, unspecified: Secondary | ICD-10-CM

## 2023-09-16 DIAGNOSIS — F439 Reaction to severe stress, unspecified: Secondary | ICD-10-CM

## 2023-09-16 DIAGNOSIS — G479 Sleep disorder, unspecified: Secondary | ICD-10-CM

## 2023-09-16 DIAGNOSIS — Z713 Dietary counseling and surveillance: Secondary | ICD-10-CM

## 2023-09-16 DIAGNOSIS — E7800 Pure hypercholesterolemia, unspecified: Secondary | ICD-10-CM

## 2023-09-16 MED ORDER — WEGOVY 0.5 MG/0.5ML ~~LOC~~ SOAJ: 0.5000 mg | SUBCUTANEOUS | 2 refills | Status: DC

## 2023-09-16 NOTE — Assessment & Plan Note (Signed)
 Overall appears to be handling things well.  Follow.  ?

## 2023-09-16 NOTE — Assessment & Plan Note (Signed)
 Recent calcium score 0. Continue diet and exercise. Desires not to take cholesterol medicaiton. Reviewed recent lipid panel. LDL 155.  The 10-year ASCVD risk score (Arnett DK, et al., 2019) is: 4.1%   Values used to calculate the score:     Age: 63 years     Sex: Female     Is Non-Hispanic African American: No     Diabetic: No     Tobacco smoker: No     Systolic Blood Pressure: 118 mmHg     Is BP treated: No     HDL Cholesterol: 55 mg/dL     Total Cholesterol: 223 mg/dL

## 2023-09-16 NOTE — Progress Notes (Signed)
 Subjective:    Patient ID: Meghan Valdez, female    DOB: 11-02-1960, 63 y.o.   MRN: [id number]  Patient here for  Chief Complaint  Patient presents with   Medical Management of Chronic Issues    HPI Here for a scheduled follow up - follow up regarding weight loss, hypercholesterolemia and increased stress. On wegovy . Dose increased to .5mg  last visit.  She reports she feels good. She has lost more weight. Reports will notice some nausea the first day, but resolves that day. No vomiting bowels moving. She is eating. Some change with taste. Does not like the taste of coffee as much. No chest pain or sob reported. Some increased stress with work. She feels she is handling things relatively well. Not sleeping well. Wakes up multiple times per night. Mind can race. Does have a history of snoring. Not mentioned recently. Discussed possible sleep apnea. Declines further evaluation at this time.    Past Medical History:  Diagnosis Date   Allergic rhinitis    Basal cell carcinoma of skin    scalp- removed 1998   Chicken pox    Chronic sinusitis    Dysplastic nevus 07/30/2022   L mid back - mild   History of migraine headaches    Hypercholesterolemia    Migraines    Past Surgical History:  Procedure Laterality Date   BREAST EXCISIONAL BIOPSY Left 1986   CESAREAN SECTION  1992   LAPAROSCOPIC CHOLECYSTECTOMY  2008   LIPOSUCTION     SEPTOPLASTY  1993   SKIN CANCER EXCISION  1998   basal cell carcinoma of the scalp   TOTAL ABDOMINAL HYSTERECTOMY W/ BILATERAL SALPINGOOPHORECTOMY     lysis of adhesions   Family History  Problem Relation Age of Onset   Hypertension Mother    Hyperlipidemia Mother    Diabetes Mother    Rheumatic fever Father    Breast cancer Maternal Aunt        great   Breast cancer Paternal Aunt        great   Hypertension Maternal Grandmother    Diabetes Brother    Colon cancer Neg Hx    Social History   Socioeconomic History   Marital status: Divorced     Spouse name: Not on file   Number of children: 2   Years of education: Not on file   Highest education level: Bachelor's degree (e.g., BA, AB, BS)  Occupational History   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  Tobacco Use   Smoking status: Never   Smokeless tobacco: Never  Substance and Sexual Activity   Alcohol use: Yes    Alcohol/week: 0.0 standard drinks of alcohol   Drug use: No   Sexual activity: Not on file  Other Topics Concern   Not on file  Social History Narrative   Not on file   Social Drivers of Health   Financial Resource Strain: Low Risk  (05/16/2023)   Overall Financial Resource Strain (CARDIA)    Difficulty of Paying Living Expenses: Not hard at all  Food Insecurity: No Food Insecurity (05/16/2023)   Hunger Vital Sign    Worried About Running Out of Food in the Last Year: Never true    Ran Out of Food in the Last Year: Never true  Transportation Needs: No Transportation Needs (05/16/2023)   PRAPARE - Administrator, Civil Service (Medical): No    Lack of Transportation (Non-Medical): No  Physical Activity: Unknown (05/16/2023)   Exercise Vital  Sign    Days of Exercise per Week: 0 days    Minutes of Exercise per Session: Not on file  Stress: Stress Concern Present (05/16/2023)   [employer] of Occupational Health - Occupational Stress Questionnaire    Feeling of Stress : To some extent  Social Connections: Moderately Isolated (05/16/2023)   Social Connection and Isolation Panel [NHANES]    Frequency of Communication with Friends and Family: Twice a week    Frequency of Social Gatherings with Friends and Family: Once a week    Attends Religious Services: More than 4 times per year    Active Member of [employer] or Organizations: No    Attends Engineer, structural: Not on file    Marital Status: Divorced     Review of Systems  Constitutional:  Negative for appetite change and unexpected weight change.  HENT:  Negative for congestion and sinus  pressure.   Respiratory:  Negative for cough, chest tightness and shortness of breath.   Cardiovascular:  Negative for chest pain, palpitations and leg swelling.  Gastrointestinal:  Negative for abdominal pain, diarrhea and vomiting.  Genitourinary:  Negative for difficulty urinating and dysuria.  Musculoskeletal:  Negative for joint swelling and myalgias.  Skin:  Negative for color change and rash.  Neurological:  Negative for dizziness and headaches.  Psychiatric/Behavioral:  Negative for agitation and dysphoric mood.        Objective:     BP 112/68   Pulse 83   Temp 98.2 F (36.8 C)   Resp 16   Ht 4' 11.75" (1.518 m)   Wt 135 lb (61.2 kg)   SpO2 98%   BMI 26.59 kg/m  Wt Readings from Last 3 Encounters:  09/16/23 135 lb (61.2 kg)  07/17/23 147 lb 9.6 oz (67 kg)  05/20/23 151 lb (68.5 kg)    Physical Exam Vitals reviewed.  Constitutional:      General: She is not in acute distress.    Appearance: Normal appearance.  HENT:     Head: Normocephalic and atraumatic.     Right Ear: External ear normal.     Left Ear: External ear normal.     Mouth/Throat:     Pharynx: No oropharyngeal exudate or posterior oropharyngeal erythema.  Eyes:     General: No scleral icterus.       Right eye: No discharge.        Left eye: No discharge.     Conjunctiva/sclera: Conjunctivae normal.  Neck:     Thyroid: No thyromegaly.  Cardiovascular:     Rate and Rhythm: Normal rate and regular rhythm.  Pulmonary:     Effort: No respiratory distress.     Breath sounds: Normal breath sounds. No wheezing.  Abdominal:     General: Bowel sounds are normal.     Palpations: Abdomen is soft.     Tenderness: There is no abdominal tenderness.  Musculoskeletal:        General: No swelling or tenderness.     Cervical back: Neck supple. No tenderness.  Lymphadenopathy:     Cervical: No cervical adenopathy.  Skin:    Findings: No erythema or rash.  Neurological:     Mental Status: She is alert.   Psychiatric:        Mood and Affect: Mood normal.        Behavior: Behavior normal.         Outpatient Encounter Medications as of 09/16/2023  Medication Sig   ibuprofen (ADVIL,MOTRIN) 600  MG tablet Take 600 mg by mouth every 6 (six) hours as needed.   Semaglutide -Weight Management (WEGOVY ) 0.5 MG/0.5ML SOAJ Inject 0.5 mg into the skin once a week.   Simethicone 125 MG CAPS Take by mouth as needed.   triamcinolone  ointment (KENALOG ) 0.1 % Apply 1 application topically 2 (two) times daily. Avoid F/G/A   triamcinolone  ointment (KENALOG ) 0.1 % Apply 1 Application topically 2 (two) times daily as needed. Apply to aa, bilateral lower legs BID prn. Avoid applying to face, groin, and axilla. Use as directed. Long-term use can cause thinning of the skin.   [DISCONTINUED] magnesium  oxide (MAG-OX) 400 (240 Mg) MG tablet Take 1 tablet (400 mg total) by mouth daily. (Patient not taking: Reported on 07/31/2023)   [DISCONTINUED] Semaglutide -Weight Management (WEGOVY ) 0.5 MG/0.5ML SOAJ Inject 0.5 mg into the skin once a week.   No facility-administered encounter medications on file as of 09/16/2023.     Lab Results  Component Value Date   WBC 6.4 03/25/2023   HGB 13.6 03/25/2023   HCT 41.8 03/25/2023   PLT 257 03/25/2023   GLUCOSE 87 09/11/2023   CHOL 223 (H) 09/11/2023   TRIG 86 09/11/2023   HDL 55 09/11/2023   LDLDIRECT 158.1 03/18/2013   LDLCALC 153 (H) 09/11/2023   ALT 10 09/11/2023   AST 16 09/11/2023   NA 140 09/11/2023   K 4.9 09/11/2023   CL 100 09/11/2023   CREATININE 0.76 09/11/2023   BUN 18 09/11/2023   CO2 22 09/11/2023   TSH 1.140 03/25/2023   HGBA1C 5.4 09/11/2023    MM 3D SCREENING MAMMOGRAM BILATERAL BREAST Result Date: 03/25/2023 CLINICAL DATA:  Screening. EXAM: DIGITAL SCREENING BILATERAL MAMMOGRAM WITH TOMOSYNTHESIS AND CAD TECHNIQUE: Bilateral screening digital craniocaudal and mediolateral oblique mammograms were obtained. Bilateral screening digital breast  tomosynthesis was performed. The images were evaluated with computer-aided detection. COMPARISON:  Previous exam(s). ACR Breast Density Category b: There are scattered areas of fibroglandular density. FINDINGS: There are no findings suspicious for malignancy. IMPRESSION: No mammographic evidence of malignancy. A result letter of this screening mammogram will be mailed directly to the patient. RECOMMENDATION: Screening mammogram in one year. (Code:SM-B-01Y) BI-RADS CATEGORY  1: Negative. Electronically Signed   By: Allena Ito M.D.   On: 03/25/2023 09:20       Assessment & Plan:  Encounter for weight loss counseling Assessment & Plan: On wegovy . Feels she is tolerating relatively well. No significant nausea except for right after taking. Will continue current dose. Follow. Notify if any problems.  Bowels moving.    Hypercholesterolemia Assessment & Plan: Recent calcium score 0. Continue diet and exercise. Desires not to take cholesterol medicaiton. Reviewed recent lipid panel. LDL 155.  The 10-year ASCVD risk score (Arnett DK, et al., 2019) is: 4.1%   Values used to calculate the score:     Age: 83 years     Sex: Female     Is Non-Hispanic African American: No     Diabetic: No     Tobacco smoker: No     Systolic Blood Pressure: 118 mmHg     Is BP treated: No     HDL Cholesterol: 55 mg/dL     Total Cholesterol: 223 mg/dL   Orders: -     Basic metabolic panel with GFR -     Hepatic function panel -     Lipid panel  Hyperglycemia Assessment & Plan: Has lost weight. Continues on wegovy . A1c just checked 5.4.   Orders: -  Basic metabolic panel with GFR  Fatty liver Assessment & Plan: Continue diet and exercise.    Sleep difficulties Assessment & Plan: Discussed sleep. Discussed possible sleep apnea. Declines further testing at this time. Follow.    Stress Assessment & Plan: Overall appears to be handling things well. Follow    Other orders -     Wegovy ; Inject  0.5 mg into the skin once a week.  Dispense: 2 mL; Refill: 2     Dellar Fenton, MD

## 2023-09-16 NOTE — Assessment & Plan Note (Signed)
 Has lost weight. Continues on wegovy . A1c just checked 5.4.

## 2023-09-16 NOTE — Assessment & Plan Note (Signed)
 Continue diet and exercise.

## 2023-09-16 NOTE — Assessment & Plan Note (Signed)
 Discussed sleep. Discussed possible sleep apnea. Declines further testing at this time. Follow.

## 2023-09-16 NOTE — Assessment & Plan Note (Signed)
 On wegovy . Feels she is tolerating relatively well. No significant nausea except for right after taking. Will continue current dose. Follow. Notify if any problems.  Bowels moving.

## 2023-09-19 ENCOUNTER — Telehealth: Payer: Self-pay

## 2023-09-19 NOTE — Telephone Encounter (Signed)
 Pharmacy Patient Advocate Encounter   Received notification from CoverMyMeds that prior authorization for Wegovy  0.25MG /0.5ML auto-injectors  is required/requested.   Insurance verification completed.   The patient is insured through [facility] .   Per test claim: PA required; PA started via CoverMyMeds. KEY [id number] . Waiting for clinical questions to populate.

## 2023-09-20 ENCOUNTER — Encounter: Payer: Self-pay | Admitting: Internal Medicine

## 2023-09-22 NOTE — Telephone Encounter (Signed)
 Clinical questions have been answered and PA submitted.TO PLAN. PA currently Pending.

## 2023-09-22 NOTE — Telephone Encounter (Signed)
 Pharmacy Patient Advocate Encounter  Received notification from [employer] that Prior Authorization for Wegovy  0.25MG /0.5ML auto-injectors has been DENIED.  See denial reason below. No denial letter attached in CMM. Will attach denial letter to Media tab once received.    Message from plan:   Request Reference Number: [id number]. WEGOVY  INJ 0.25MG  is denied for not meeting the prior authorization requirement(s). WILL SCAN IN MEDIA WHEN WE RECEIVED LETTER OR DENIAL

## 2023-09-22 NOTE — Telephone Encounter (Signed)
 Insurance was covering this but we just increased her dose. It looks like PA was completed for 0.25mg . Can we resubmit?

## 2023-09-23 ENCOUNTER — Other Ambulatory Visit (HOSPITAL_COMMUNITY): Payer: Self-pay

## 2023-09-25 ENCOUNTER — Telehealth: Payer: Self-pay

## 2023-09-25 NOTE — Telephone Encounter (Signed)
 Yes, PA has been resubmitted. Thank you for catching that!

## 2023-09-25 NOTE — Telephone Encounter (Signed)
 Patient is aware

## 2023-09-25 NOTE — Telephone Encounter (Signed)
 Pharmacy Patient Advocate Encounter   Received notification from Patient Advice Request messages that prior authorization for Wegovy  0.5MG /0.5ML auto-injectors is required/requested.   Insurance verification completed.   The patient is insured through [facility] .   Per test claim: PA required; PA submitted to above mentioned insurance via CoverMyMeds Key/confirmation #/EOC [facility] Status is pending

## 2023-09-25 NOTE — Telephone Encounter (Signed)
Tried to call patient.  Phone busy.

## 2023-10-08 NOTE — Telephone Encounter (Signed)
 Pharmacy Patient Advocate Encounter  Received notification from [employer] that Prior Authorization for Wegovy  0.5MG /0.5ML auto-injectors has been APPROVED from 09/25/2023 to 03/27/2024   PA #/Case ID/Reference #: [id number]

## 2024-01-19 ENCOUNTER — Encounter: Payer: Self-pay | Admitting: Internal Medicine

## 2024-01-21 ENCOUNTER — Ambulatory Visit: Payer: Self-pay | Admitting: Internal Medicine

## 2024-01-21 ENCOUNTER — Other Ambulatory Visit: Payer: Self-pay | Admitting: Internal Medicine

## 2024-01-21 LAB — LIPID PANEL
Chol/HDL Ratio: 3.4 ratio (ref 0.0–4.4)
Cholesterol, Total: 218 mg/dL — ABNORMAL HIGH (ref 100–199)
HDL: 65 mg/dL (ref >39)
LDL Chol Calc (NIH): 140 mg/dL — ABNORMAL HIGH (ref 0–99)
Triglycerides: 73 mg/dL (ref 0–149)
VLDL Cholesterol Cal: 13 mg/dL (ref 5–40)

## 2024-01-21 LAB — BASIC METABOLIC PANEL WITH GFR
BUN/Creatinine Ratio: 30 — ABNORMAL HIGH (ref 12–28)
BUN: 21 mg/dL (ref 8–27)
CO2: 22 mmol/L (ref 20–29)
Calcium: 9.8 mg/dL (ref 8.7–10.3)
Chloride: 104 mmol/L (ref 96–106)
Creatinine, Ser: 0.7 mg/dL (ref 0.57–1.00)
Glucose: 86 mg/dL (ref 70–99)
Potassium: 4.2 mmol/L (ref 3.5–5.2)
Sodium: 142 mmol/L (ref 134–144)
eGFR: 97 mL/min/1.73 (ref >59)

## 2024-01-21 LAB — HEPATIC FUNCTION PANEL
ALT: 8 IU/L (ref 0–32)
AST: 14 IU/L (ref 0–40)
Albumin: 4.3 g/dL (ref 3.9–4.9)
Alkaline Phosphatase: 85 IU/L (ref 44–121)
Bilirubin Total: 0.6 mg/dL (ref 0.0–1.2)
Bilirubin, Direct: 0.16 mg/dL (ref 0.00–0.40)
Total Protein: 6.4 g/dL (ref 6.0–8.5)

## 2024-01-22 ENCOUNTER — Encounter: Payer: Self-pay | Admitting: Internal Medicine

## 2024-01-22 ENCOUNTER — Ambulatory Visit: Admitting: Internal Medicine

## 2024-01-22 ENCOUNTER — Ambulatory Visit: Payer: Self-pay | Admitting: Internal Medicine

## 2024-01-22 VITALS — BP 110/68 | HR 88 | Resp 16 | Ht 60.0 in | Wt 117.8 lb

## 2024-01-22 DIAGNOSIS — E7800 Pure hypercholesterolemia, unspecified: Secondary | ICD-10-CM | POA: Diagnosis not present

## 2024-01-22 DIAGNOSIS — E559 Vitamin D deficiency, unspecified: Secondary | ICD-10-CM

## 2024-01-22 DIAGNOSIS — K760 Fatty (change of) liver, not elsewhere classified: Secondary | ICD-10-CM

## 2024-01-22 DIAGNOSIS — F439 Reaction to severe stress, unspecified: Secondary | ICD-10-CM

## 2024-01-22 DIAGNOSIS — R739 Hyperglycemia, unspecified: Secondary | ICD-10-CM | POA: Diagnosis not present

## 2024-01-22 DIAGNOSIS — Z7185 Encounter for immunization safety counseling: Secondary | ICD-10-CM | POA: Insufficient documentation

## 2024-01-22 LAB — POCT GLYCOSYLATED HEMOGLOBIN (HGB A1C): Hemoglobin A1C: 5.2 % (ref 4.0–5.6)

## 2024-01-22 NOTE — Assessment & Plan Note (Signed)
 Increased stess. Discussed. She does not feel she needs further intervention at this time. Follow.

## 2024-01-22 NOTE — Assessment & Plan Note (Signed)
 Has lost weight. Recent liver function tests wnl.

## 2024-01-22 NOTE — Progress Notes (Signed)
 Subjective:    Patient ID: Meghan  LITTIE Valdez, female    DOB: 01/26/1961, 63 y.o.   MRN: [id number]  Patient here for  Chief Complaint  Patient presents with   Medical Management of Chronic Issues    HPI Here for a scheduled follow up - follow up regarding weight loss, hypercholesterolemia and increased stress. On wegovy . Has lost weight. Decreased po intake. Still some nausea. Bowels are moving, but some change. Has had a couple of episodes of urgency (stool). Discussed stopping wegovy  given persistent GI symptoms. She is agreeable. No chest pain or sob reported. Increased stress - work. Discussed. Does not feel needs further intervention.    Past Medical History:  Diagnosis Date   Allergic rhinitis    Basal cell carcinoma of skin    scalp- removed 1998   Chicken pox    Chronic sinusitis    Dysplastic nevus 07/30/2022   L mid back - mild   History of migraine headaches    Hypercholesterolemia    Migraines    Past Surgical History:  Procedure Laterality Date   BREAST EXCISIONAL BIOPSY Left 1986   CESAREAN SECTION  1992   LAPAROSCOPIC CHOLECYSTECTOMY  2008   LIPOSUCTION     SEPTOPLASTY  1993   SKIN CANCER EXCISION  1998   basal cell carcinoma of the scalp   TOTAL ABDOMINAL HYSTERECTOMY W/ BILATERAL SALPINGOOPHORECTOMY     lysis of adhesions   Family History  Problem Relation Age of Onset   Hypertension Mother    Hyperlipidemia Mother    Diabetes Mother    Rheumatic fever Father    Breast cancer Maternal Aunt        great   Breast cancer Paternal Aunt        great   Hypertension Maternal Grandmother    Diabetes Brother    Colon cancer Neg Hx    Social History   Socioeconomic History   Marital status: Divorced    Spouse name: Not on file   Number of children: 2   Years of education: Not on file   Highest education level: Bachelor's degree (e.g., BA, AB, BS)  Occupational History   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  Tobacco Use   Smoking status: Never    Smokeless tobacco: Never  Substance and Sexual Activity   Alcohol use: Yes    Alcohol/week: 0.0 standard drinks of alcohol   Drug use: No   Sexual activity: Not on file  Other Topics Concern   Not on file  Social History Narrative   Not on file   Social Drivers of Health   Financial Resource Strain: Patient Declined (01/15/2024)   Overall Financial Resource Strain (CARDIA)    Difficulty of Paying Living Expenses: Patient declined  Food Insecurity: Patient Declined (01/15/2024)   Hunger Vital Sign    Worried About Running Out of Food in the Last Year: Patient declined    Ran Out of Food in the Last Year: Patient declined  Transportation Needs: Patient Declined (01/15/2024)   PRAPARE - Administrator, Civil Service (Medical): Patient declined    Lack of Transportation (Non-Medical): Patient declined  Physical Activity: Unknown (01/15/2024)   Exercise Vital Sign    Days of Exercise per Week: Patient declined    Minutes of Exercise per Session: Not on file  Stress: Patient Declined (01/15/2024)   [employer] of Occupational Health - Occupational Stress Questionnaire    Feeling of Stress: Patient declined  Social Connections: Unknown (01/15/2024)  Social Advertising account executive    Frequency of Communication with Friends and Family: Patient declined    Frequency of Social Gatherings with Friends and Family: Patient declined    Attends Religious Services: Patient declined    Database administrator or Organizations: Patient declined    Attends Engineer, structural: Not on file    Marital Status: Patient declined     Review of Systems  Constitutional:  Positive for appetite change.       Has lost weight.   HENT:  Negative for congestion and sinus pressure.   Respiratory:  Negative for cough, chest tightness and shortness of breath.   Cardiovascular:  Negative for chest pain, palpitations and leg swelling.  Gastrointestinal:  Negative for abdominal pain,  diarrhea, nausea and vomiting.  Genitourinary:  Negative for difficulty urinating and dysuria.  Musculoskeletal:  Negative for joint swelling and myalgias.  Skin:  Negative for color change and rash.  Neurological:  Negative for dizziness and headaches.  Psychiatric/Behavioral:  Negative for agitation and dysphoric mood.        Increased stress.        Objective:     BP 110/68   Pulse 88   Resp 16   Ht 5' (1.524 m)   Wt 117 lb 12.8 oz (53.4 kg)   SpO2 99%   BMI 23.01 kg/m  Wt Readings from Last 3 Encounters:  01/22/24 117 lb 12.8 oz (53.4 kg)  09/16/23 135 lb (61.2 kg)  07/17/23 147 lb 9.6 oz (67 kg)    Physical Exam Vitals reviewed.  Constitutional:      General: She is not in acute distress.    Appearance: Normal appearance.  HENT:     Head: Normocephalic and atraumatic.     Right Ear: External ear normal.     Left Ear: External ear normal.     Mouth/Throat:     Pharynx: No oropharyngeal exudate or posterior oropharyngeal erythema.  Eyes:     General: No scleral icterus.       Right eye: No discharge.        Left eye: No discharge.     Conjunctiva/sclera: Conjunctivae normal.  Neck:     Thyroid: No thyromegaly.  Cardiovascular:     Rate and Rhythm: Normal rate and regular rhythm.  Pulmonary:     Effort: No respiratory distress.     Breath sounds: Normal breath sounds. No wheezing.  Abdominal:     General: Bowel sounds are normal.     Palpations: Abdomen is soft.     Tenderness: There is no abdominal tenderness.  Musculoskeletal:        General: No swelling or tenderness.     Cervical back: Neck supple. No tenderness.  Lymphadenopathy:     Cervical: No cervical adenopathy.  Skin:    Findings: No erythema or rash.  Neurological:     Mental Status: She is alert.  Psychiatric:        Mood and Affect: Mood normal.        Behavior: Behavior normal.         Outpatient Encounter Medications as of 01/22/2024  Medication Sig   ibuprofen (ADVIL,MOTRIN)  600 MG tablet Take 600 mg by mouth every 6 (six) hours as needed.   Simethicone 125 MG CAPS Take by mouth as needed.   triamcinolone  ointment (KENALOG ) 0.1 % Apply 1 application topically 2 (two) times daily. Avoid F/G/A   triamcinolone  ointment (KENALOG ) 0.1 % Apply 1  Application topically 2 (two) times daily as needed. Apply to aa, bilateral lower legs BID prn. Avoid applying to face, groin, and axilla. Use as directed. Long-term use can cause thinning of the skin.   [DISCONTINUED] WEGOVY  0.5 MG/0.5ML SOAJ SQ injection INJECT THE CONTENTS OF ONE PEN UNDER THE SKIN ONCE WEEKLY ON THE SAME DAY EACH WEEK   No facility-administered encounter medications on file as of 01/22/2024.     Lab Results  Component Value Date   WBC 6.4 03/25/2023   HGB 13.6 03/25/2023   HCT 41.8 03/25/2023   PLT 257 03/25/2023   GLUCOSE 86 01/20/2024   CHOL 218 (H) 01/20/2024   TRIG 73 01/20/2024   HDL 65 01/20/2024   LDLDIRECT 158.1 03/18/2013   LDLCALC 140 (H) 01/20/2024   ALT 8 01/20/2024   AST 14 01/20/2024   NA 142 01/20/2024   K 4.2 01/20/2024   CL 104 01/20/2024   CREATININE 0.70 01/20/2024   BUN 21 01/20/2024   CO2 22 01/20/2024   TSH 1.140 03/25/2023   HGBA1C 5.2 01/22/2024    MM 3D SCREENING MAMMOGRAM BILATERAL BREAST Result Date: 03/25/2023 CLINICAL DATA:  Screening. EXAM: DIGITAL SCREENING BILATERAL MAMMOGRAM WITH TOMOSYNTHESIS AND CAD TECHNIQUE: Bilateral screening digital craniocaudal and mediolateral oblique mammograms were obtained. Bilateral screening digital breast tomosynthesis was performed. The images were evaluated with computer-aided detection. COMPARISON:  Previous exam(s). ACR Breast Density Category b: There are scattered areas of fibroglandular density. FINDINGS: There are no findings suspicious for malignancy. IMPRESSION: No mammographic evidence of malignancy. A result letter of this screening mammogram will be mailed directly to the patient. RECOMMENDATION: Screening mammogram in  one year. (Code:SM-B-01Y) BI-RADS CATEGORY  1: Negative. Electronically Signed   By: Inocente Ast M.D.   On: 03/25/2023 09:20       Assessment & Plan:  Hyperglycemia Assessment & Plan: Has lost weight. Continues on wegovy . A1c just checked 5.2. will stop wegovy  given increased GI symptoms. Continue to monitor diet as she is doing. Discussed adequate protein intake. Follow.   Orders: -     POCT glycosylated hemoglobin (Hb A1C) -     Basic metabolic panel with GFR  Hypercholesterolemia Assessment & Plan: Recent calcium score 0. Continue diet and exercise. Desires not to take cholesterol medicaiton. Have discussed. Follow lipid panel. LDL 140. Improved.  The 10-year ASCVD risk score (Arnett DK, et al., 2019) is: 3.3%   Values used to calculate the score:     Age: 74 years     Clincally relevant sex: Female     Is Non-Hispanic African American: No     Diabetic: No     Tobacco smoker: No     Systolic Blood Pressure: 110 mmHg     Is BP treated: No     HDL Cholesterol: 65 mg/dL     Total Cholesterol: 218 mg/dL   Orders: -     Lipid panel -     Hepatic function panel -     Basic metabolic panel with GFR -     TSH -     CBC with Differential/Platelet  Vitamin D  deficiency Assessment & Plan: Check vitamin d  level with next labs.   Orders: -     VITAMIN D  25 Hydroxy (Vit-D Deficiency, Fractures)  Fatty liver Assessment & Plan: Has lost weight. Recent liver function tests wnl.    Stress Assessment & Plan: Increased stess. Discussed. She does not feel she needs further intervention at this time. Follow.    Vaccine  counseling Assessment & Plan: Discussed vaccines. Will get shingles vaccine through pharmacy. Will consider pneumonia vaccine.       Allena Hamilton, MD

## 2024-01-22 NOTE — Assessment & Plan Note (Addendum)
 Recent calcium score 0. Continue diet and exercise. Desires not to take cholesterol medicaiton. Have discussed. Follow lipid panel. LDL 140. Improved.  The 10-year ASCVD risk score (Arnett DK, et al., 2019) is: 3.3%   Values used to calculate the score:     Age: 63 years     Clincally relevant sex: Female     Is Non-Hispanic African American: No     Diabetic: No     Tobacco smoker: No     Systolic Blood Pressure: 110 mmHg     Is BP treated: No     HDL Cholesterol: 65 mg/dL     Total Cholesterol: 218 mg/dL

## 2024-01-22 NOTE — Assessment & Plan Note (Signed)
 Has lost weight. Continues on wegovy . A1c just checked 5.2. will stop wegovy  given increased GI symptoms. Continue to monitor diet as she is doing. Discussed adequate protein intake. Follow.

## 2024-01-22 NOTE — Assessment & Plan Note (Signed)
Check vitamin d level with next labs.  

## 2024-01-22 NOTE — Assessment & Plan Note (Signed)
 Discussed vaccines. Will get shingles vaccine through pharmacy. Will consider pneumonia vaccine.

## 2024-02-11 ENCOUNTER — Other Ambulatory Visit

## 2024-02-11 ENCOUNTER — Telehealth: Payer: Self-pay | Admitting: Internal Medicine

## 2024-02-11 DIAGNOSIS — R300 Dysuria: Secondary | ICD-10-CM

## 2024-02-11 NOTE — Addendum Note (Signed)
 Addended by: MARYLEN PRO A on: 02/11/2024 02:33 PM   Modules accepted: Orders

## 2024-02-11 NOTE — Telephone Encounter (Signed)
 Is she still having symptoms? I do not see results of a urine culture from two weeks ago. Where did she have her urine checked. If symptoms, let me know, will need to work in for appt.

## 2024-02-11 NOTE — Telephone Encounter (Signed)
 Spoke with pt and she stated that she did a telehealth appt about 2 weeks ago for a UTI. There was not a urine collected. She was treated however the symptoms have returned. She stated yesterday she started having urgency, frequency, burning and stinging with urination. I have scheduled pt for a lab appt this afternoon and a follow up appt with Dr. Glendia on Friday at 7:30.  Labs have been ordered.

## 2024-02-11 NOTE — Telephone Encounter (Signed)
 Copied from CRM #[id number]. Topic: General - Other >> Feb 11, 2024  1:39 PM Aleatha C wrote: Reason for CRM: Patient recently had a UTI less than 2 weeks ago and is still having symptoms she want to  drop off a urine specimen for a urine culture but she doesn't have any orders Please give her a call about what she needs to do next call back # [phone]

## 2024-02-12 ENCOUNTER — Encounter: Payer: Self-pay | Admitting: Internal Medicine

## 2024-02-12 ENCOUNTER — Ambulatory Visit: Payer: Self-pay | Admitting: Internal Medicine

## 2024-02-12 MED ORDER — CEFDINIR 300 MG PO CAPS: 300.0000 mg | ORAL_CAPSULE | Freq: Two times a day (BID) | ORAL | 0 refills | Status: DC

## 2024-02-12 NOTE — Telephone Encounter (Signed)
 called pt to notify her she is aware of the antibiotic medication being sent into the pharmacy which she is agreeable to. Allergies to Macrobid and Sulfamethoxazole. I let her know when you send the medication in the pharmacy will notify her when it is ready for pick up

## 2024-02-12 NOTE — Telephone Encounter (Signed)
 Rx sent in for omnicef to [facility].  Let me know if any problems. I sent my chart message to Ms Laird as well.

## 2024-02-12 NOTE — Telephone Encounter (Signed)
 She sees you at 7:30 in the morning.

## 2024-02-12 NOTE — Telephone Encounter (Signed)
 Please call and notify her that I was going to wait on culture results, but given urine findings and increased symptoms, I will go ahead and call in an abx. Confirm on allergies are sulfa and macrobid. If so, then can send in rx for omnicef 300mg  bid x 5 days. Let me know if any problems or questions.

## 2024-02-13 ENCOUNTER — Ambulatory Visit: Admitting: Internal Medicine

## 2024-02-13 ENCOUNTER — Encounter: Payer: Self-pay | Admitting: Internal Medicine

## 2024-02-13 VITALS — BP 120/70 | HR 88 | Resp 16 | Ht 60.0 in | Wt 118.0 lb

## 2024-02-13 DIAGNOSIS — N3000 Acute cystitis without hematuria: Secondary | ICD-10-CM

## 2024-02-13 DIAGNOSIS — N390 Urinary tract infection, site not specified: Secondary | ICD-10-CM

## 2024-02-13 DIAGNOSIS — Z9109 Other allergy status, other than to drugs and biological substances: Secondary | ICD-10-CM

## 2024-02-13 NOTE — Assessment & Plan Note (Signed)
 Symptoms as outlined. Lungs clear. No chest congestion or sob. No fever. No sinus pressure. Flonase and zyrtec as directed. Follow.

## 2024-02-13 NOTE — Assessment & Plan Note (Addendum)
 Symptoms and urinalysis appear to be c/w UTI. Per report, has had three UTIs this year. Previously saw Dr Ike. Request f/u with urology given recurring issues.  Treat with omnicef as directed. Stay hydrated. Probiotic discussed.

## 2024-02-13 NOTE — Assessment & Plan Note (Signed)
 Refer to urology as outlined.

## 2024-02-13 NOTE — Progress Notes (Signed)
 Subjective:    Patient ID: Eshal  LITTIE Hales, female    DOB: 1961-04-26, 63 y.o.   MRN: [id number]  Patient here for  Chief Complaint  Patient presents with   Urinary Tract Infection    HPI Here for work in appt - work in with concerns regarding possible UTI. Recently stopped wegovy  due to persistent GI issues. Off wegovy . GI symptoms have improved. Was treated two weks ago for UTI (01/30/24). Treated without urine sample through telehealth visit. Reported that noticed return of symptoms 02/10/24 - urinary urgency, urinary frequency, dysuria. Urinalysis appears to be c/w UTI. Omnicef called in yesterday. She is to pick up today. Denies abdominal pain. No back pain. No vaginal symptoms. Has had some allergy symptoms. Low grade fever last week - two days - resolved. No fever now. Feeling better. Some drainage and cough, but no chest congestion or sob. No vomiting.    Past Medical History:  Diagnosis Date   Allergic rhinitis    Basal cell carcinoma of skin    scalp- removed 1998   Chicken pox    Chronic sinusitis    Dysplastic nevus 07/30/2022   L mid back - mild   History of migraine headaches    Hypercholesterolemia    Migraines    Past Surgical History:  Procedure Laterality Date   BREAST EXCISIONAL BIOPSY Left 1986   CESAREAN SECTION  1992   LAPAROSCOPIC CHOLECYSTECTOMY  2008   LIPOSUCTION     SEPTOPLASTY  1993   SKIN CANCER EXCISION  1998   basal cell carcinoma of the scalp   TOTAL ABDOMINAL HYSTERECTOMY W/ BILATERAL SALPINGOOPHORECTOMY     lysis of adhesions   Family History  Problem Relation Age of Onset   Hypertension Mother    Hyperlipidemia Mother    Diabetes Mother    Rheumatic fever Father    Breast cancer Maternal Aunt        great   Breast cancer Paternal Aunt        great   Hypertension Maternal Grandmother    Diabetes Brother    Colon cancer Neg Hx    Social History   Socioeconomic History   Marital status: Divorced    Spouse name: Not on file    Number of children: 2   Years of education: Not on file   Highest education level: Bachelor's degree (e.g., BA, AB, BS)  Occupational History   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  Tobacco Use   Smoking status: Never   Smokeless tobacco: Never  Substance and Sexual Activity   Alcohol use: Yes    Alcohol/week: 0.0 standard drinks of alcohol   Drug use: No   Sexual activity: Not on file  Other Topics Concern   Not on file  Social History Narrative   Not on file   Social Drivers of Health   Financial Resource Strain: Patient Declined (01/15/2024)   Overall Financial Resource Strain (CARDIA)    Difficulty of Paying Living Expenses: Patient declined  Food Insecurity: Patient Declined (01/15/2024)   Hunger Vital Sign    Worried About Running Out of Food in the Last Year: Patient declined    Ran Out of Food in the Last Year: Patient declined  Transportation Needs: Patient Declined (01/15/2024)   PRAPARE - Administrator, Civil Service (Medical): Patient declined    Lack of Transportation (Non-Medical): Patient declined  Physical Activity: Unknown (01/15/2024)   Exercise Vital Sign    Days of Exercise per Week: Patient  declined    Minutes of Exercise per Session: Not on file  Stress: Patient Declined (01/15/2024)   [employer] of Occupational Health - Occupational Stress Questionnaire    Feeling of Stress: Patient declined  Social Connections: Unknown (01/15/2024)   Social Connection and Isolation Panel    Frequency of Communication with Friends and Family: Patient declined    Frequency of Social Gatherings with Friends and Family: Patient declined    Attends Religious Services: Patient declined    Database administrator or Organizations: Patient declined    Attends Engineer, structural: Not on file    Marital Status: Patient declined     Review of Systems  Constitutional:  Negative for appetite change and unexpected weight change.  HENT:         Drainage and  dripping nose. No sore throat.   Respiratory:  Negative for chest tightness and shortness of breath.        Some minimal cough.   Cardiovascular:  Negative for chest pain, palpitations and leg swelling.  Gastrointestinal:  Negative for abdominal pain, diarrhea, nausea and vomiting.  Genitourinary:  Positive for dysuria, frequency and urgency. Negative for vaginal discharge.  Musculoskeletal:  Negative for back pain, joint swelling and myalgias.  Skin:  Negative for color change and rash.  Neurological:  Negative for dizziness and headaches.  Psychiatric/Behavioral:  Negative for agitation and dysphoric mood.        Objective:     BP 120/70   Pulse 88   Resp 16   Ht 5' (1.524 m)   Wt 118 lb (53.5 kg)   SpO2 99%   BMI 23.05 kg/m  Wt Readings from Last 3 Encounters:  02/13/24 118 lb (53.5 kg)  01/22/24 117 lb 12.8 oz (53.4 kg)  09/16/23 135 lb (61.2 kg)    Physical Exam Vitals reviewed.  Constitutional:      General: She is not in acute distress.    Appearance: Normal appearance.  HENT:     Head: Normocephalic and atraumatic.     Right Ear: External ear normal.     Left Ear: External ear normal.     Mouth/Throat:     Pharynx: No oropharyngeal exudate or posterior oropharyngeal erythema.  Eyes:     General: No scleral icterus.       Right eye: No discharge.        Left eye: No discharge.     Conjunctiva/sclera: Conjunctivae normal.  Neck:     Thyroid: No thyromegaly.  Cardiovascular:     Rate and Rhythm: Normal rate and regular rhythm.  Pulmonary:     Effort: No respiratory distress.     Breath sounds: Normal breath sounds. No wheezing.  Abdominal:     General: Bowel sounds are normal.     Palpations: Abdomen is soft.     Tenderness: There is no abdominal tenderness.  Musculoskeletal:        General: No swelling or tenderness.     Cervical back: Neck supple. No tenderness.     Comments: No CVA tenderness.   Lymphadenopathy:     Cervical: No cervical  adenopathy.  Skin:    Findings: No erythema or rash.  Neurological:     Mental Status: She is alert.  Psychiatric:        Mood and Affect: Mood normal.        Behavior: Behavior normal.         Outpatient Encounter Medications as of 02/13/2024  Medication  Sig   cefdinir (OMNICEF) 300 MG capsule Take 1 capsule (300 mg total) by mouth 2 (two) times daily.   ibuprofen (ADVIL,MOTRIN) 600 MG tablet Take 600 mg by mouth every 6 (six) hours as needed.   Simethicone 125 MG CAPS Take by mouth as needed.   triamcinolone  ointment (KENALOG ) 0.1 % Apply 1 application topically 2 (two) times daily. Avoid F/G/A   triamcinolone  ointment (KENALOG ) 0.1 % Apply 1 Application topically 2 (two) times daily as needed. Apply to aa, bilateral lower legs BID prn. Avoid applying to face, groin, and axilla. Use as directed. Long-term use can cause thinning of the skin.   No facility-administered encounter medications on file as of 02/13/2024.     Lab Results  Component Value Date   WBC 6.4 03/25/2023   HGB 13.6 03/25/2023   HCT 41.8 03/25/2023   PLT 257 03/25/2023   GLUCOSE 86 01/20/2024   CHOL 218 (H) 01/20/2024   TRIG 73 01/20/2024   HDL 65 01/20/2024   LDLDIRECT 158.1 03/18/2013   LDLCALC 140 (H) 01/20/2024   ALT 8 01/20/2024   AST 14 01/20/2024   NA 142 01/20/2024   K 4.2 01/20/2024   CL 104 01/20/2024   CREATININE 0.70 01/20/2024   BUN 21 01/20/2024   CO2 22 01/20/2024   TSH 1.140 03/25/2023   HGBA1C 5.2 01/22/2024    MM 3D SCREENING MAMMOGRAM BILATERAL BREAST Result Date: 03/25/2023 CLINICAL DATA:  Screening. EXAM: DIGITAL SCREENING BILATERAL MAMMOGRAM WITH TOMOSYNTHESIS AND CAD TECHNIQUE: Bilateral screening digital craniocaudal and mediolateral oblique mammograms were obtained. Bilateral screening digital breast tomosynthesis was performed. The images were evaluated with computer-aided detection. COMPARISON:  Previous exam(s). ACR Breast Density Category b: There are scattered areas of  fibroglandular density. FINDINGS: There are no findings suspicious for malignancy. IMPRESSION: No mammographic evidence of malignancy. A result letter of this screening mammogram will be mailed directly to the patient. RECOMMENDATION: Screening mammogram in one year. (Code:SM-B-01Y) BI-RADS CATEGORY  1: Negative. Electronically Signed   By: Inocente Ast M.D.   On: 03/25/2023 09:20       Assessment & Plan:  Acute cystitis without hematuria Assessment & Plan: Symptoms and urinalysis appear to be c/w UTI. Per report, has had three UTIs this year. Previously saw Dr Ike. Request f/u with urology given recurring issues.  Treat with omnicef as directed. Stay hydrated. Probiotic discussed.    Frequent UTI Assessment & Plan: Refer to urology as outlined.    Environmental allergies Assessment & Plan: Symptoms as outlined. Lungs clear. No chest congestion or sob. No fever. No sinus pressure. Flonase and zyrtec as directed. Follow.       Allena Hamilton, MD

## 2024-02-14 LAB — MICROSCOPIC MESSAGE

## 2024-02-14 LAB — URINALYSIS, ROUTINE W REFLEX MICROSCOPIC
Glucose, UA: NEGATIVE
Hgb urine dipstick: NEGATIVE
Hyaline Cast: NONE SEEN /LPF
Ketones, ur: NEGATIVE
Nitrite: POSITIVE — AB
Specific Gravity, Urine: 1.009 (ref 1.001–1.035)
Squamous Epithelial / HPF: NONE SEEN /HPF (ref <=5)
pH: <=5 — AB (ref 5.0–8.0)

## 2024-02-14 LAB — URINE CULTURE
MICRO NUMBER:: 17042267
SPECIMEN QUALITY:: ADEQUATE

## 2024-02-17 ENCOUNTER — Other Ambulatory Visit: Payer: Self-pay | Admitting: Internal Medicine

## 2024-02-17 DIAGNOSIS — N390 Urinary tract infection, site not specified: Secondary | ICD-10-CM

## 2024-02-17 NOTE — Progress Notes (Signed)
 Order placed for urology referral.

## 2024-03-01 NOTE — Progress Notes (Signed)
 03/02/2024 10:36 PM   Meghan Valdez 07/29/1960 [id number]  Referring provider: Glendia Shad, MD [address] [place],  [place] [address]  Urological history: 1. 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 rUTI's - February 11, 2024-E. Coli  Chief Complaint  Patient presents with   Establish Care   Recurrent UTI   HPI: Meghan  L Valdez is a 63 y.o. woman who presents today for recurrent UTIs  Previous records reviewed.  She was seen by Dr. Ike back in 2017 for similar issues.  At that time she was noted to have microscopic hematuria and underwent a hematuria workup which had no worrisome findings.  She was prescribed vaginal estrogen cream.  She then was seen by Dr. Penne in 2020, and at that time she was no longer using the vaginal estrogen cream.  She was encouraged to restart the estrogen cream.  She has had 1-7 daytime voids, 1-2 episodes of nocturia with a mild urge to urinate.  She does not leak.  She does not wear absorbent products for urinary leakage.  She does not limit fluid intake and she does not engage in toilet mapping.  Patient states that she has had 4 urinary tract infections over the last year.  She was using [facility], so there are no UA or urine cultures available.  She states she had UTI's in January, April, August and October.      Her symptoms with a urinary tract infection consist of cloudy urine with odor, urgency and frequency  She has been having urgency, frequency, urge incontinence, urge incontinence and discomfort.  She has used pyridium which seems to help.    She is/is not postmenopausal.   She is not having vaginal discharge or vaginal irritation.   She is not using estrogen cream.    Hemoglobin A1c of 5.2 in September.  Serum creatinine of 0.70, eGFR of 97 in September.  UA yellow clear, specific gravity 1.020, pH 6.0, trace leukocytes, 0-5 WBC's, 0-2 RBC's, 0-10 epithelial  cells and few bacteria.    PVR 0 mL   She is going on a River Cruise to [place] in December and would like to have antibiotics on hand in case she gets an UTI on her trip.   PMH: Past Medical History:  Diagnosis Date   Allergic rhinitis    Basal cell carcinoma of skin    scalp- removed 1998   Chicken pox    Chronic sinusitis    Dysplastic nevus 07/30/2022   L mid back - mild   History of migraine headaches    Hypercholesterolemia    Migraines     Surgical History: Past Surgical History:  Procedure Laterality Date   BREAST EXCISIONAL BIOPSY Left 1986   CESAREAN SECTION  1992   LAPAROSCOPIC CHOLECYSTECTOMY  2008   LIPOSUCTION     SEPTOPLASTY  1993   SKIN CANCER EXCISION  1998   basal cell carcinoma of the scalp   TOTAL ABDOMINAL HYSTERECTOMY W/ BILATERAL SALPINGOOPHORECTOMY     lysis of adhesions    Home Medications:  Allergies as of 03/02/2024       Reactions   Macrobid [nitrofurantoin] Hives, Swelling   Hives, lip swelling.   Sulfamethoxazole-trimethoprim Hives   Other Reaction(s): Not available   Sulfa Antibiotics Hives        Medication List        Accurate as of March 02, 2024 11:59 PM.  If you have any questions, ask your nurse or doctor.          cefdinir 300 MG capsule Commonly known as: OMNICEF Take 1 capsule (300 mg total) by mouth 2 (two) times daily.   estradiol 0.01 % Crea vaginal cream Commonly known as: ESTRACE Apply one pea-sized amount around the opening of the urethra daily for 30 days, then 3 times weekly moving forward.   ibuprofen 600 MG tablet Commonly known as: ADVIL Take 600 mg by mouth every 6 (six) hours as needed.   Simethicone 125 MG Caps Take by mouth as needed.   triamcinolone  ointment 0.1 % Commonly known as: KENALOG  Apply 1 application topically 2 (two) times daily. Avoid F/G/A   triamcinolone  ointment 0.1 % Commonly known as: KENALOG  Apply 1 Application topically 2 (two) times daily as needed. Apply to aa,  bilateral lower legs BID prn. Avoid applying to face, groin, and axilla. Use as directed. Long-term use can cause thinning of the skin.        Allergies:  Allergies  Allergen Reactions   Macrobid [Nitrofurantoin] Hives and Swelling    Hives, lip swelling.   Sulfamethoxazole-Trimethoprim Hives    Other Reaction(s): Not available   Sulfa Antibiotics Hives    Family History: Family History  Problem Relation Age of Onset   Hypertension Mother    Hyperlipidemia Mother    Diabetes Mother    Rheumatic fever Father    Breast cancer Maternal Aunt        great   Breast cancer Paternal Aunt        great   Hypertension Maternal Grandmother    Diabetes Brother    Colon cancer Neg Hx     Social History:  reports that she has never smoked. She has never used smokeless tobacco. She reports current alcohol use. She reports that she does not use drugs.  ROS: Pertinent ROS in HPI  Physical Exam: BP 125/76   Pulse 76   Ht 5' (1.524 m)   Wt 114 lb (51.7 kg)   BMI 22.26 kg/m   Constitutional:  Well nourished. Alert and oriented, No acute distress. HEENT: [place] AT, moist mucus membranes.  Trachea midline Cardiovascular: No clubbing, cyanosis, or edema. Respiratory: Normal respiratory effort, no increased work of breathing. Neurologic: Grossly intact, no focal deficits, moving all 4 extremities. Psychiatric: Normal mood and affect.    Laboratory Data: See Epic and HPI   I have reviewed the labs.   Pertinent Imaging:  03/02/24 15:23  Scan Result 0 ml    Assessment & Plan:    1. Genitourinary Syndrome of Menopause (GSM)  - Explained that GSM is a common condition that affects women during and after menopause. It is characterized by a cluster of symptoms related to the genital and urinary tracts, including: vaginal dryness, pain or discomfort during intercourse (dyspareunia), burning or irritation in the vulva or vagina, thinning or loss of vaginal tissue, frequent urination,  urgency (feeling the need to urinate immediately), incontinence (loss of bladder control), and pain or burning during urination - explained that GSM is primarily caused by the decline in estrogen levels during menopause. This leads to changes in the vaginal tissue, making it thinner, drier, and more susceptible to irritation. Other factors that may contribute to GSM include: Smoking, certain medications (e.g., chemotherapy drugs, and pelvic organ prolapse - advised that First-line treatment options are: Local low-dose vaginal estrogen (cream, ring, tablet), which improves dryness, irritation, dyspareunia and it is safe for most  patients, including those with history of breast cancer (with multidisciplinary input). - advised they can also use vaginal moisturizers and lubricants (alone or combined) and avoid irritants and harsh cleansers.  Pelvic floor physical therapy for dysfunction is also helpful.  Referral to sex therapy or counseling if psychosocial concerns are present. - Reassured that there is no evidence linking local estrogen to breast or endometrial cancer - Explained that it may take up to 8 months of consistent application of the vaginal estrogen cream to cause unnecessary changes needed to address GSM and that this will be a lifelong medication - Will start with vaginal estrogen cream, applying a pea-sized amount with a fingertip just inside the vaginal introitus every night for 30 days and then continuing on to Monday, Wednesday and Friday nights - estradiol (ESTRACE) 0.01 % CREA vaginal cream, Apply one pea-sized amount around the opening of the urethra daily for 30 days, then 3 times weekly moving forward, sent to pharmacy   2. rUTI's - given a script for Omnicef for her trip in December - today's UA is bland   Return in about 3 months (around 06/02/2024) for Symptom recheck and exam .  These notes generated with voice recognition software. I apologize for typographical  errors.  [name]  [facility] [address]  [address] [place], [place] [address] [phone]

## 2024-03-02 ENCOUNTER — Ambulatory Visit: Admitting: Urology

## 2024-03-02 VITALS — BP 125/76 | HR 76 | Ht 60.0 in | Wt 114.0 lb

## 2024-03-02 DIAGNOSIS — N390 Urinary tract infection, site not specified: Secondary | ICD-10-CM | POA: Diagnosis not present

## 2024-03-02 DIAGNOSIS — N952 Postmenopausal atrophic vaginitis: Secondary | ICD-10-CM | POA: Diagnosis not present

## 2024-03-02 LAB — MICROSCOPIC EXAMINATION

## 2024-03-02 LAB — URINALYSIS, COMPLETE
Bilirubin, UA: NEGATIVE
Glucose, UA: NEGATIVE
Ketones, UA: NEGATIVE
Nitrite, UA: NEGATIVE
Protein,UA: NEGATIVE
RBC, UA: NEGATIVE
Specific Gravity, UA: 1.02 (ref 1.005–1.030)
Urobilinogen, Ur: 0.2 mg/dL (ref 0.2–1.0)
pH, UA: 6 (ref 5.0–7.5)

## 2024-03-02 LAB — BLADDER SCAN AMB NON-IMAGING: Scan Result: 0

## 2024-03-02 MED ORDER — ESTRADIOL 0.01 % VA CREA: TOPICAL_CREAM | VAGINAL | 12 refills | Status: DC

## 2024-03-02 MED ORDER — CEFDINIR 300 MG PO CAPS: 300.0000 mg | ORAL_CAPSULE | Freq: Two times a day (BID) | ORAL | 0 refills | Status: DC

## 2024-03-05 ENCOUNTER — Other Ambulatory Visit (HOSPITAL_COMMUNITY): Payer: Self-pay

## 2024-03-07 ENCOUNTER — Encounter: Payer: Self-pay | Admitting: Urology

## 2024-03-08 ENCOUNTER — Other Ambulatory Visit (HOSPITAL_COMMUNITY): Payer: Self-pay

## 2024-03-09 ENCOUNTER — Other Ambulatory Visit (HOSPITAL_COMMUNITY): Payer: Self-pay

## 2024-03-10 ENCOUNTER — Other Ambulatory Visit (HOSPITAL_COMMUNITY): Payer: Self-pay

## 2024-03-12 ENCOUNTER — Other Ambulatory Visit (HOSPITAL_COMMUNITY): Payer: Self-pay

## 2024-03-29 ENCOUNTER — Ambulatory Visit: Payer: Self-pay | Admitting: Urology

## 2024-03-29 ENCOUNTER — Other Ambulatory Visit

## 2024-03-29 DIAGNOSIS — N390 Urinary tract infection, site not specified: Secondary | ICD-10-CM

## 2024-03-29 LAB — URINALYSIS, COMPLETE
Bilirubin, UA: NEGATIVE
Glucose, UA: NEGATIVE
Ketones, UA: NEGATIVE
Nitrite, UA: NEGATIVE
Protein,UA: NEGATIVE
Specific Gravity, UA: 1.01 (ref 1.005–1.030)
Urobilinogen, Ur: 0.2 mg/dL (ref 0.2–1.0)
pH, UA: 7 (ref 5.0–7.5)

## 2024-03-29 LAB — MICROSCOPIC EXAMINATION: WBC, UA: >30 /HPF — AB (ref 0–5)

## 2024-03-29 MED ORDER — AMOXICILLIN-POT CLAVULANATE 875-125 MG PO TABS: 1.0000 | ORAL_TABLET | Freq: Two times a day (BID) | ORAL | 0 refills | Status: DC

## 2024-04-01 LAB — CULTURE, URINE COMPREHENSIVE

## 2024-04-28 ENCOUNTER — Encounter: Payer: Self-pay | Admitting: Internal Medicine

## 2024-04-30 ENCOUNTER — Ambulatory Visit: Payer: Self-pay | Admitting: Primary Care

## 2024-04-30 ENCOUNTER — Ambulatory Visit: Admitting: Primary Care

## 2024-04-30 VITALS — BP 120/84 | HR 89 | Temp 97.9°F | Ht 60.0 in | Wt 116.8 lb

## 2024-04-30 DIAGNOSIS — R051 Acute cough: Secondary | ICD-10-CM

## 2024-04-30 LAB — POC COVID19 BINAXNOW: SARS Coronavirus 2 Ag: NEGATIVE

## 2024-04-30 LAB — POCT RAPID STREP A (OFFICE): Rapid Strep A Screen: NEGATIVE

## 2024-04-30 LAB — POCT INFLUENZA A/B
Influenza A, POC: NEGATIVE
Influenza B, POC: NEGATIVE

## 2024-04-30 NOTE — Assessment & Plan Note (Signed)
 Symptoms and presentation representative of viral etiology.  Rapid flu, COVID, strep test negative in office today Fortunately, she is feeling better and exam is reassuring.  Vitals are within normal range.  Continue Coricidin as needed. Work note provided per patient request. Follow-up as needed

## 2024-04-30 NOTE — Patient Instructions (Signed)
 He may continue Coricidin as needed.  Rest and hydrate as discussed.  It was a pleasure meeting you!

## 2024-04-30 NOTE — Progress Notes (Signed)
 "  Subjective:    Patient ID: Meghan  LITTIE Valdez, female    DOB: 11/30/1960, 63 y.o.   MRN: [id number]  Meghan Valdez is a very pleasant 63 y.o. female patient of Dr. Glendia with a history of migraines, GERD, epistaxis, environmental allergies who presents today to discuss URI symptoms  Symptom onset 5 days ago with a scratchy/sore throat.  She then developed cough with chest congestion, rhinorrhea, fevers (t-max of 102).  Prior to symptom onset she was in [place], [place] on a group trip. Several people on this trip were sick with the same symptoms.   She's been taking Coricidin HBP with improvement. Today she's feeling better overall in terms of her congestion and fevers. She missed work yesterday and today at work.    Review of Systems  Constitutional:  Positive for chills and fever.  HENT:  Positive for congestion and sore throat.   Respiratory:  Positive for cough.          Past Medical History:  Diagnosis Date   Allergic rhinitis    Basal cell carcinoma of skin    scalp- removed 1998   Chicken pox    Chronic sinusitis    Dysplastic nevus 07/30/2022   L mid back - mild   History of migraine headaches    Hypercholesterolemia    Migraines     Social History   Socioeconomic History   Marital status: Divorced    Spouse name: Not on file   Number of children: 2   Years of education: Not on file   Highest education level: Bachelor's degree (e.g., BA, AB, BS)  Occupational History   Occupation: retired - med best boy  Tobacco Use   Smoking status: Never   Smokeless tobacco: Never  Substance and Sexual Activity   Alcohol use: Yes    Alcohol/week: 0.0 standard drinks of alcohol   Drug use: No   Sexual activity: Not on file  Other Topics Concern   Not on file  Social History Narrative   Not on file   Social Drivers of Health   Tobacco Use: Low Risk (03/07/2024)   Patient History    Smoking Tobacco Use: Never    Smokeless Tobacco Use: Never    Passive Exposure: Not  on file  Financial Resource Strain: Patient Declined (04/29/2024)   Overall Financial Resource Strain (CARDIA)    Difficulty of Paying Living Expenses: Patient declined  Food Insecurity: Patient Declined (04/29/2024)   Epic    Worried About Programme Researcher, Broadcasting/film/video in the Last Year: Patient declined    Barista in the Last Year: Patient declined  Transportation Needs: No Transportation Needs (04/29/2024)   Epic    Lack of Transportation (Medical): No    Lack of Transportation (Non-Medical): No  Physical Activity: Inactive (04/29/2024)   Exercise Vital Sign    Days of Exercise per Week: 0 days    Minutes of Exercise per Session: Not on file  Stress: Patient Declined (04/29/2024)   [employer] of Occupational Health - Occupational Stress Questionnaire    Feeling of Stress: Patient declined  Social Connections: Unknown (04/29/2024)   Social Connection and Isolation Panel    Frequency of Communication with Friends and Family: Patient declined    Frequency of Social Gatherings with Friends and Family: Patient declined    Attends Religious Services: Patient declined    Active Member of Clubs or Organizations: Patient declined    Attends Banker Meetings: Not on file  Marital Status: Patient declined  Intimate Partner Violence: Not on file  Depression (PHQ2-9): Low Risk (01/22/2024)   Depression (PHQ2-9)    PHQ-2 Score: 0  Alcohol Screen: Low Risk (04/29/2024)   Alcohol Screen    Last Alcohol Screening Score (AUDIT): 1  Housing: Low Risk (04/29/2024)   Epic    Unable to Pay for Housing in the Last Year: No    Number of Times Moved in the Last Year: 0    Homeless in the Last Year: No  Utilities: Not on file  Health Literacy: Not on file    Past Surgical History:  Procedure Laterality Date   BREAST EXCISIONAL BIOPSY Left 1986   CESAREAN SECTION  1992   LAPAROSCOPIC CHOLECYSTECTOMY  2008   LIPOSUCTION     SEPTOPLASTY  1993   SKIN CANCER EXCISION  1998    basal cell carcinoma of the scalp   TOTAL ABDOMINAL HYSTERECTOMY W/ BILATERAL SALPINGOOPHORECTOMY     lysis of adhesions    Family History  Problem Relation Age of Onset   Hypertension Mother    Hyperlipidemia Mother    Diabetes Mother    Rheumatic fever Father    Breast cancer Maternal Aunt        great   Breast cancer Paternal Aunt        great   Hypertension Maternal Grandmother    Diabetes Brother    Colon cancer Neg Hx     Allergies[1]  Medications Ordered Prior to Encounter[2]  BP 120/84   Pulse 89   Temp 97.9 F (36.6 C) (Oral)   Ht 5' (1.524 m)   Wt 116 lb 12.8 oz (53 kg)   SpO2 98%   BMI 22.81 kg/m  Objective:   Physical Exam Constitutional:      Appearance: She is not ill-appearing.  HENT:     Right Ear: Tympanic membrane and ear canal normal.     Left Ear: Tympanic membrane and ear canal normal.     Nose: No mucosal edema.     Right Sinus: No maxillary sinus tenderness or frontal sinus tenderness.     Left Sinus: No maxillary sinus tenderness or frontal sinus tenderness.     Mouth/Throat:     Mouth: Mucous membranes are moist.  Eyes:     Conjunctiva/sclera: Conjunctivae normal.  Cardiovascular:     Rate and Rhythm: Normal rate and regular rhythm.  Pulmonary:     Effort: Pulmonary effort is normal.     Breath sounds: Normal breath sounds. No wheezing or rhonchi.  Musculoskeletal:     Cervical back: Neck supple.  Skin:    General: Skin is warm and dry.     Physical Exam        Assessment & Plan:  Acute cough Assessment & Plan: Symptoms and presentation representative of viral etiology.  Rapid flu, COVID, strep test negative in office today Fortunately, she is feeling better and exam is reassuring.  Vitals are within normal range.  Continue Coricidin as needed. Work note provided per patient request. Follow-up as needed  Orders: -     POC COVID-19 BinaxNow -     POCT rapid strep A -     POCT Influenza A/B    Assessment and  Plan Assessment & Plan         Comer MARLA Gaskins, NP       [1]  Allergies Allergen Reactions   Macrobid [Nitrofurantoin] Hives and Swelling    Hives, lip swelling.  Sulfamethoxazole-Trimethoprim Hives    Other Reaction(s): Not available   Sulfa Antibiotics Hives  [2]  Current Outpatient Medications on File Prior to Visit  Medication Sig Dispense Refill   amoxicillin -clavulanate (AUGMENTIN ) 875-125 MG tablet Take 1 tablet by mouth every 12 (twelve) hours. 14 tablet 0   estradiol  (ESTRACE ) 0.01 % CREA vaginal cream Apply one pea-sized amount around the opening of the urethra daily for 30 days, then 3 times weekly moving forward. 42.5 g 12   ibuprofen (ADVIL,MOTRIN) 600 MG tablet Take 600 mg by mouth every 6 (six) hours as needed.     Simethicone 125 MG CAPS Take by mouth as needed.     triamcinolone  ointment (KENALOG ) 0.1 % Apply 1 application topically 2 (two) times daily. Avoid F/G/A     triamcinolone  ointment (KENALOG ) 0.1 % Apply 1 Application topically 2 (two) times daily as needed. Apply to aa, bilateral lower legs BID prn. Avoid applying to face, groin, and axilla. Use as directed. Long-term use can cause thinning of the skin. 15 g 4   No current facility-administered medications on file prior to visit.   "

## 2024-05-10 ENCOUNTER — Ambulatory Visit: Admitting: Internal Medicine

## 2024-05-10 ENCOUNTER — Encounter: Payer: Self-pay | Admitting: Internal Medicine

## 2024-05-10 VITALS — BP 112/68 | HR 72 | Ht 60.0 in | Wt <= 1120 oz

## 2024-05-10 DIAGNOSIS — N390 Urinary tract infection, site not specified: Secondary | ICD-10-CM | POA: Diagnosis not present

## 2024-05-10 DIAGNOSIS — R300 Dysuria: Secondary | ICD-10-CM | POA: Diagnosis not present

## 2024-05-10 LAB — POCT URINALYSIS DIPSTICK
Bilirubin, UA: NEGATIVE
Blood, UA: NEGATIVE
Glucose, UA: NEGATIVE
Ketones, UA: NEGATIVE
Leukocytes, UA: NEGATIVE
Nitrite, UA: NEGATIVE
Protein, UA: NEGATIVE
Spec Grav, UA: <=1.005 — AB
Urobilinogen, UA: 0.2 U/dL
pH, UA: 6

## 2024-05-10 NOTE — Progress Notes (Unsigned)
 "  Subjective:  Patient ID: Meghan Valdez, female    DOB: 03/06/1961  Age: 63 y.o. MRN: [id number]  CC: The primary encounter diagnosis was Dysuria. A diagnosis of Frequent UTI was also pertinent to this visit.   HPI Meghan Valdez presents for  Chief Complaint  Patient presents with   Dysuria   Meghan Valdez  is a 63 yr old female with atrophic vaginitis, recurrent symptoms of UTI who presents for follow up on most recent UTI. Patient developed symptoms of dysuria, frequency and urgency while taking a river cruise in [place]  in early December.  She took an antibiotic given to her by Urology and symptoms resolved after 3 days.  Symptoms returned last week,  WERE WORSE ON FRIDAY BUT HAVE IMPROVED since then without intervention.  Currently all UTI symptoms have resolved.   Her last documented UTI was Nov 17,  E Coli and was treated with augmentin .  Prior to Nov 17 she had a documented UTI  on Oct 1 secondary to E Coli and was treated with Omnicef .  She has had multiple treatments  for UTI in the past year without confirmation  of infection due to the nature of televisits. She was examined by Urology and prescribed Vaginal estrogen , which was started in November: however, she did not follow the directions and used it nightly for  only one week,  then reduced use to 3 times weekly.    Outpatient Medications Prior to Visit  Medication Sig Dispense Refill   estradiol  (ESTRACE ) 0.01 % CREA vaginal cream Apply one pea-sized amount around the opening of the urethra daily for 30 days, then 3 times weekly moving forward. 42.5 g 12   ibuprofen (ADVIL,MOTRIN) 600 MG tablet Take 600 mg by mouth every 6 (six) hours as needed.     Simethicone 125 MG CAPS Take by mouth as needed.     triamcinolone  ointment (KENALOG ) 0.1 % Apply 1 Application topically 2 (two) times daily as needed. Apply to aa, bilateral lower legs BID prn. Avoid applying to face, groin, and axilla. Use as directed. Long-term use can cause  thinning of the skin. 15 g 4   triamcinolone  ointment (KENALOG ) 0.1 % Apply 1 application topically 2 (two) times daily. Avoid F/G/A     amoxicillin -clavulanate (AUGMENTIN ) 875-125 MG tablet Take 1 tablet by mouth every 12 (twelve) hours. 14 tablet 0   No facility-administered medications prior to visit.    Review of Systems;  Patient denies headache, fevers, malaise, unintentional weight loss, skin rash, eye pain, sinus congestion and sinus pain, sore throat, dysphagia,  hemoptysis , cough, dyspnea, wheezing, chest pain, palpitations, orthopnea, edema, abdominal pain, nausea, melena, diarrhea, constipation, flank pain, dysuria, hematuria, urinary  Frequency, nocturia, numbness, tingling, seizures,  Focal weakness, Loss of consciousness,  Tremor, insomnia, depression, anxiety, and suicidal ideation.      Objective:  BP 112/68   Pulse 72   Ht 5' (1.524 m)   Wt 12 lb 9.6 oz (5.715 kg)   SpO2 99%   BMI 2.46 kg/m   BP Readings from Last 3 Encounters:  05/10/24 112/68  04/30/24 120/84  03/02/24 125/76    Wt Readings from Last 3 Encounters:  05/10/24 12 lb 9.6 oz (5.715 kg)  04/30/24 116 lb 12.8 oz (53 kg)  03/02/24 114 lb (51.7 kg)    Physical Exam Vitals reviewed.  Constitutional:      General: She is not in acute distress.    Appearance: Normal appearance. She is normal  weight. She is not ill-appearing, toxic-appearing or diaphoretic.  HENT:     Head: Normocephalic.  Eyes:     General: No scleral icterus.       Right eye: No discharge.        Left eye: No discharge.     Conjunctiva/sclera: Conjunctivae normal.  Cardiovascular:     Rate and Rhythm: Normal rate and regular rhythm.     Heart sounds: Normal heart sounds.  Pulmonary:     Effort: Pulmonary effort is normal. No respiratory distress.     Breath sounds: Normal breath sounds.  Musculoskeletal:        General: Normal range of motion.  Skin:    General: Skin is warm and dry.  Neurological:     General: No  focal deficit present.     Mental Status: She is alert and oriented to person, place, and time. Mental status is at baseline.  Psychiatric:        Mood and Affect: Mood normal.        Behavior: Behavior normal.        Thought Content: Thought content normal.        Judgment: Judgment normal.     Lab Results  Component Value Date   HGBA1C 5.2 01/22/2024   HGBA1C 5.4 09/11/2023   HGBA1C 5.7 (H) 03/25/2023    Lab Results  Component Value Date   CREATININE 0.70 01/20/2024   CREATININE 0.76 09/11/2023   CREATININE 0.90 03/25/2023    Lab Results  Component Value Date   WBC 6.4 03/25/2023   HGB 13.6 03/25/2023   HCT 41.8 03/25/2023   PLT 257 03/25/2023   GLUCOSE 86 01/20/2024   CHOL 218 (H) 01/20/2024   TRIG 73 01/20/2024   HDL 65 01/20/2024   LDLDIRECT 158.1 03/18/2013   LDLCALC 140 (H) 01/20/2024   ALT 8 01/20/2024   AST 14 01/20/2024   NA 142 01/20/2024   K 4.2 01/20/2024   CL 104 01/20/2024   CREATININE 0.70 01/20/2024   BUN 21 01/20/2024   CO2 22 01/20/2024   TSH 1.140 03/25/2023   HGBA1C 5.2 01/22/2024    MM 3D SCREENING MAMMOGRAM BILATERAL BREAST Result Date: 03/25/2023 CLINICAL DATA:  Screening. EXAM: DIGITAL SCREENING BILATERAL MAMMOGRAM WITH TOMOSYNTHESIS AND CAD TECHNIQUE: Bilateral screening digital craniocaudal and mediolateral oblique mammograms were obtained. Bilateral screening digital breast tomosynthesis was performed. The images were evaluated with computer-aided detection. COMPARISON:  Previous exam(s). ACR Breast Density Category b: There are scattered areas of fibroglandular density. FINDINGS: There are no findings suspicious for malignancy. IMPRESSION: No mammographic evidence of malignancy. A result letter of this screening mammogram will be mailed directly to the patient. RECOMMENDATION: Screening mammogram in one year. (Code:SM-B-01Y) BI-RADS CATEGORY  1: Negative. Electronically Signed   By: Inocente Ast M.D.   On: 03/25/2023 09:20     Assessment & Plan:  .Dysuria -     POCT urinalysis dipstick -     Urine Culture -     Urine Microscopic  Frequent UTI Assessment & Plan: Today's POC UA is normal.  Micro and culture are pending.  Advised that the vaginal estrogen was dosed more frequently than what she was using and may need to be increased to the initiating dose of nightly for one month to resolve symptoms caused by atrophic urethral tissue.       Follow-up: No follow-ups on file.   Meghan LITTIE Kettering, MD "

## 2024-05-11 NOTE — Assessment & Plan Note (Signed)
 Today's POC UA is normal.  Micro and culture are pending.  Advised that the vaginal estrogen was dosed more frequently than what she was using and may need to be increased to the initiating dose of nightly for one month to resolve symptoms caused by atrophic urethral tissue.

## 2024-05-24 ENCOUNTER — Encounter: Payer: Self-pay | Admitting: Internal Medicine

## 2024-05-24 ENCOUNTER — Other Ambulatory Visit (HOSPITAL_COMMUNITY)
Admission: RE | Admit: 2024-05-24 | Discharge: 2024-05-24 | Disposition: A | Discharge status: Home / Self Care | Source: Ambulatory Visit | Attending: Internal Medicine | Admitting: Internal Medicine

## 2024-05-24 ENCOUNTER — Ambulatory Visit (INDEPENDENT_AMBULATORY_CARE_PROVIDER_SITE_OTHER): Admitting: Internal Medicine

## 2024-05-24 VITALS — BP 122/66 | HR 71 | Temp 97.9°F | Ht 60.0 in | Wt 121.0 lb

## 2024-05-24 DIAGNOSIS — N949 Unspecified condition associated with female genital organs and menstrual cycle: Secondary | ICD-10-CM

## 2024-05-24 DIAGNOSIS — Z0000 Encounter for general adult medical examination without abnormal findings: Secondary | ICD-10-CM

## 2024-05-24 DIAGNOSIS — E559 Vitamin D deficiency, unspecified: Secondary | ICD-10-CM

## 2024-05-24 DIAGNOSIS — E7800 Pure hypercholesterolemia, unspecified: Secondary | ICD-10-CM

## 2024-05-24 DIAGNOSIS — K760 Fatty (change of) liver, not elsewhere classified: Secondary | ICD-10-CM | POA: Diagnosis not present

## 2024-05-24 DIAGNOSIS — R739 Hyperglycemia, unspecified: Secondary | ICD-10-CM | POA: Diagnosis not present

## 2024-05-24 DIAGNOSIS — F439 Reaction to severe stress, unspecified: Secondary | ICD-10-CM | POA: Diagnosis not present

## 2024-05-24 DIAGNOSIS — N390 Urinary tract infection, site not specified: Secondary | ICD-10-CM

## 2024-05-24 DIAGNOSIS — Z1231 Encounter for screening mammogram for malignant neoplasm of breast: Secondary | ICD-10-CM

## 2024-05-24 DIAGNOSIS — Z113 Encounter for screening for infections with a predominantly sexual mode of transmission: Secondary | ICD-10-CM | POA: Insufficient documentation

## 2024-05-24 DIAGNOSIS — R300 Dysuria: Secondary | ICD-10-CM

## 2024-05-24 NOTE — Assessment & Plan Note (Addendum)
 Physical today 05/24/24.  Colonoscopy 12/2011.  Have discussed f/u colonoscopy given family history of colon polyps. Refer to GI for f/u colonoscopy. Mammogram 03/20/23 - [facility] I.  Schedule mammogram.

## 2024-05-24 NOTE — Progress Notes (Signed)
 "  Subjective:    Patient ID: Meghan  LITTIE Valdez, female    DOB: [birth year], 64 y.o.   MRN: [id number]  Patient here for  Chief Complaint  Patient presents with   Annual Exam    HPI Here for a physical exam. Has been having issues with recurring UTIs. Saw urology 03/02/24. Placed on estrogen vaginal cream. Using. No vaginal discharge. No odor. Some vaginal discomfort. Is s/p hysterectomy. Walking. Had been on wegovy . Has lost weight. 01/2024 visit- wegovy  was stopped given persistent GI issues. Maintaining weight loss. No chest pain or sob reported. No abdominal pain reported.    Past Medical History:  Diagnosis Date   Allergic rhinitis    Allergy    Arthritis    Basal cell carcinoma of skin    scalp- removed 1998   Chicken pox    Chronic sinusitis    Dysplastic nevus 07/30/2022   L mid back - mild   History of migraine headaches    Hypercholesterolemia    Migraines    Seizures (HCC) 1971   Past Surgical History:  Procedure Laterality Date   ABDOMINAL HYSTERECTOMY  2005   BREAST EXCISIONAL BIOPSY Left 1986   BREAST SURGERY  1986   Lumpectomy   CESAREAN SECTION  1992   COSMETIC SURGERY  1985   LAPAROSCOPIC CHOLECYSTECTOMY  2008   LIPOSUCTION     SEPTOPLASTY  1993   SKIN CANCER EXCISION  1998   basal cell carcinoma of the scalp   TOTAL ABDOMINAL HYSTERECTOMY W/ BILATERAL SALPINGOOPHORECTOMY     lysis of adhesions   Family History  Problem Relation Age of Onset   Hypertension Mother    Hyperlipidemia Mother    Diabetes Mother    Rheumatic fever Father    Hearing loss Father    Hyperlipidemia Father    Breast cancer Maternal Aunt        great   Breast cancer Paternal Aunt        great   Hypertension Maternal Grandmother    Diabetes Brother    Hypertension Brother    Stroke Brother    Cancer Brother    Colon cancer Neg Hx    Social History   Socioeconomic History   Marital status: Divorced    Spouse name: Not on file   Number of children: 2   Years of  education: Not on file   Highest education level: Bachelor's degree (e.g., BA, AB, BS)  Occupational History   Occupation: retired - med best boy  Tobacco Use   Smoking status: Never   Smokeless tobacco: Never  Substance and Sexual Activity   Alcohol use: Yes    Alcohol/week: 0.0 standard drinks of alcohol   Drug use: No   Sexual activity: Not on file  Other Topics Concern   Not on file  Social History Narrative   Not on file   Social Drivers of Health   Tobacco Use: Low Risk (05/30/2024)   Patient History    Smoking Tobacco Use: Never    Smokeless Tobacco Use: Never    Passive Exposure: Not on file  Financial Resource Strain: Patient Declined (04/29/2024)   Overall Financial Resource Strain (CARDIA)    Difficulty of Paying Living Expenses: Patient declined  Food Insecurity: Patient Declined (04/29/2024)   Epic    Worried About Programme Researcher, Broadcasting/film/video in the Last Year: Patient declined    Barista in the Last Year: Patient declined  Transportation Needs: No Transportation Needs (04/29/2024)  Epic    Lack of Transportation (Medical): No    Lack of Transportation (Non-Medical): No  Physical Activity: Inactive (04/29/2024)   Exercise Vital Sign    Days of Exercise per Week: 0 days    Minutes of Exercise per Session: Not on file  Stress: Patient Declined (04/29/2024)   [employer] of Occupational Health - Occupational Stress Questionnaire    Feeling of Stress: Patient declined  Social Connections: Unknown (04/29/2024)   Social Connection and Isolation Panel    Frequency of Communication with Friends and Family: Patient declined    Frequency of Social Gatherings with Friends and Family: Patient declined    Attends Religious Services: Patient declined    Active Member of Clubs or Organizations: Patient declined    Attends Banker Meetings: Not on file    Marital Status: Patient declined  Depression (PHQ2-9): Low Risk (05/10/2024)   Depression (PHQ2-9)     PHQ-2 Score: 0  Alcohol Screen: Low Risk (04/29/2024)   Alcohol Screen    Last Alcohol Screening Score (AUDIT): 1  Housing: Low Risk (04/29/2024)   Epic    Unable to Pay for Housing in the Last Year: No    Number of Times Moved in the Last Year: 0    Homeless in the Last Year: No  Utilities: Not on file  Health Literacy: Not on file     Review of Systems  Constitutional:  Negative for appetite change and unexpected weight change.  HENT:  Negative for congestion, sinus pressure and sore throat.   Eyes:  Negative for pain and visual disturbance.  Respiratory:  Negative for cough, chest tightness and shortness of breath.   Cardiovascular:  Negative for chest pain, palpitations and leg swelling.  Gastrointestinal:  Negative for abdominal pain, diarrhea, nausea and vomiting.  Genitourinary:  Negative for difficulty urinating and dysuria.       Vaginal discomfort as outlined. Using estrogen cream.   Musculoskeletal:  Negative for joint swelling and myalgias.  Skin:  Negative for color change and rash.  Neurological:  Negative for dizziness and headaches.  Hematological:  Negative for adenopathy. Does not bruise/bleed easily.  Psychiatric/Behavioral:  Negative for agitation and dysphoric mood.        Objective:     BP 122/66   Pulse 71   Temp 97.9 F (36.6 C) (Oral)   Ht 5' (1.524 m)   Wt 121 lb (54.9 kg)   SpO2 98%   BMI 23.63 kg/m  Wt Readings from Last 3 Encounters:  05/24/24 121 lb (54.9 kg)  05/10/24 12 lb 9.6 oz (5.715 kg)  04/30/24 116 lb 12.8 oz (53 kg)    Physical Exam Vitals reviewed.  Constitutional:      General: She is not in acute distress.    Appearance: Normal appearance. She is well-developed.  HENT:     Head: Normocephalic and atraumatic.     Right Ear: External ear normal.     Left Ear: External ear normal.     Mouth/Throat:     Pharynx: No oropharyngeal exudate or posterior oropharyngeal erythema.  Eyes:     General: No scleral icterus.        Right eye: No discharge.        Left eye: No discharge.     Conjunctiva/sclera: Conjunctivae normal.  Neck:     Thyroid: No thyromegaly.  Cardiovascular:     Rate and Rhythm: Normal rate and regular rhythm.  Pulmonary:     Effort: No tachypnea,  accessory muscle usage or respiratory distress.     Breath sounds: Normal breath sounds. No decreased breath sounds or wheezing.  Chest:  Breasts:    Right: No inverted nipple, mass, nipple discharge or tenderness (no axillary adenopathy).     Left: No inverted nipple, mass, nipple discharge or tenderness (no axilarry adenopathy).  Abdominal:     General: Bowel sounds are normal.     Palpations: Abdomen is soft.     Tenderness: There is no abdominal tenderness.  Genitourinary:    Comments: Normal external genitalia.  Vaginal vault without lesions.  S/p hysterectomy. Wet prep obtained. Could not appreciate any adnexal masses or tenderness.   Musculoskeletal:        General: No swelling or tenderness.     Cervical back: Neck supple.  Lymphadenopathy:     Cervical: No cervical adenopathy.  Skin:    Findings: No erythema or rash.  Neurological:     Mental Status: She is alert and oriented to person, place, and time.  Psychiatric:        Mood and Affect: Mood normal.        Behavior: Behavior normal.         Outpatient Encounter Medications as of 05/24/2024  Medication Sig   estradiol  (ESTRACE ) 0.01 % CREA vaginal cream Apply one pea-sized amount around the opening of the urethra daily for 30 days, then 3 times weekly moving forward.   ibuprofen (ADVIL,MOTRIN) 600 MG tablet Take 600 mg by mouth every 6 (six) hours as needed.   Simethicone 125 MG CAPS Take by mouth as needed.   triamcinolone  ointment (KENALOG ) 0.1 % Apply 1 Application topically 2 (two) times daily as needed. Apply to aa, bilateral lower legs BID prn. Avoid applying to face, groin, and axilla. Use as directed. Long-term use can cause thinning of the skin.   [DISCONTINUED]  triamcinolone  ointment (KENALOG ) 0.1 % Apply 1 application topically 2 (two) times daily. Avoid F/G/A   No facility-administered encounter medications on file as of 05/24/2024.     Lab Results  Component Value Date   WBC 6.4 03/25/2023   HGB 13.6 03/25/2023   HCT 41.8 03/25/2023   PLT 257 03/25/2023   GLUCOSE 86 01/20/2024   CHOL 218 (H) 01/20/2024   TRIG 73 01/20/2024   HDL 65 01/20/2024   LDLDIRECT 158.1 03/18/2013   LDLCALC 140 (H) 01/20/2024   ALT 8 01/20/2024   AST 14 01/20/2024   NA 142 01/20/2024   K 4.2 01/20/2024   CL 104 01/20/2024   CREATININE 0.70 01/20/2024   BUN 21 01/20/2024   CO2 22 01/20/2024   TSH 1.140 03/25/2023   HGBA1C 5.2 01/22/2024    MM 3D SCREENING MAMMOGRAM BILATERAL BREAST Result Date: 03/25/2023 CLINICAL DATA:  Screening. EXAM: DIGITAL SCREENING BILATERAL MAMMOGRAM WITH TOMOSYNTHESIS AND CAD TECHNIQUE: Bilateral screening digital craniocaudal and mediolateral oblique mammograms were obtained. Bilateral screening digital breast tomosynthesis was performed. The images were evaluated with computer-aided detection. COMPARISON:  Previous exam(s). ACR Breast Density Category b: There are scattered areas of fibroglandular density. FINDINGS: There are no findings suspicious for malignancy. IMPRESSION: No mammographic evidence of malignancy. A result letter of this screening mammogram will be mailed directly to the patient. RECOMMENDATION: Screening mammogram in one year. (Code:SM-B-01Y) BI-RADS CATEGORY  1: Negative. Electronically Signed   By: Inocente Ast M.D.   On: 03/25/2023 09:20       Assessment & Plan:  Routine general medical examination at a health care facility  Dysuria -  Urinalysis, Routine w reflex microscopic -     Urine Culture  Hypercholesterolemia Assessment & Plan: Recent calcium score 0. Continue diet and exercise. Desires not to take cholesterol medicaiton. Have discussed. Follow lipid panel.  The 10-year ASCVD risk score  (Arnett DK, et al., 2019) is: 4.4%   Values used to calculate the score:     Age: 59 years     Clinically relevant sex: Female     Is Non-Hispanic African American: No     Diabetic: No     Tobacco smoker: No     Systolic Blood Pressure: 122 mmHg     Is BP treated: No     HDL Cholesterol: 65 mg/dL     Total Cholesterol: 218 mg/dL   Orders: -     Hepatic function panel -     Basic metabolic panel with GFR -     CBC with Differential/Platelet -     Lipid Panel+ApoB -     TSH -     C-reactive protein  Hyperglycemia Assessment & Plan: Was previously on wegovy . Lost weight. Off now. Maintaining weight loss. Follow met b and A1c. Request insulin level to be checked.   Orders: -     Hemoglobin A1c -     Basic metabolic panel with GFR -     Insulin, random  Health care maintenance Assessment & Plan: Physical today 05/24/24.  Colonoscopy 12/2011.  Have discussed f/u colonoscopy given family history of colon polyps. Refer to GI for f/u colonoscopy. Mammogram 03/20/23 - Birads I.  Schedule mammogram.    Visit for screening mammogram -     3D Screening Mammogram, Left and Right; Future  Vaginal discomfort Assessment & Plan: Using estrogen cream. With vaginal discomfort, refer to PFPT for further evaluation and treatment.   Orders: -     Cervicovaginal ancillary only -     Ambulatory referral to Physical Therapy  Fatty liver Assessment & Plan: Has lost weight. Continue diet and exercise.   Orders: -     Gamma GT  Stress Assessment & Plan: Overall appears to be handling things relatively well. Follow.    Vitamin D  deficiency Assessment & Plan: Check vitamin D  level.   Orders: -     VITAMIN D  25 Hydroxy (Vit-D Deficiency, Fractures) -     Vitamin B12 -     Folate RBC -     Magnesium  -     Iron, TIBC and Ferritin Panel  Frequent UTI Assessment & Plan: Using vaginal estrogen cream currently. Has f/u with urology scheduled. Discussed vaginal discomfort. Refer to PFPT.    Orders: -     Ambulatory referral to Physical Therapy     Allena Hamilton, MD "

## 2024-05-25 LAB — URINALYSIS, ROUTINE W REFLEX MICROSCOPIC
Bilirubin Urine: NEGATIVE
Hgb urine dipstick: NEGATIVE
Ketones, ur: NEGATIVE
Nitrite: POSITIVE — AB
Specific Gravity, Urine: 1.01 (ref 1.000–1.030)
Total Protein, Urine: NEGATIVE
Urine Glucose: NEGATIVE
Urobilinogen, UA: 0.2 (ref 0.0–1.0)
pH: 7 (ref 5.0–8.0)

## 2024-05-26 ENCOUNTER — Ambulatory Visit: Payer: Self-pay | Admitting: Internal Medicine

## 2024-05-26 DIAGNOSIS — R8290 Unspecified abnormal findings in urine: Secondary | ICD-10-CM

## 2024-05-26 LAB — CERVICOVAGINAL ANCILLARY ONLY
Bacterial Vaginitis (gardnerella): NEGATIVE
Candida Glabrata: NEGATIVE
Candida Vaginitis: NEGATIVE
Chlamydia: NEGATIVE
Comment: NEGATIVE
Comment: NEGATIVE
Comment: NEGATIVE
Comment: NEGATIVE
Comment: NEGATIVE
Comment: NORMAL
Neisseria Gonorrhea: NEGATIVE
Trichomonas: NEGATIVE

## 2024-05-27 NOTE — Telephone Encounter (Signed)
 Copied from CRM [id number]. Topic: Clinical - Lab/Test Results >> May 27, 2024 11:42 AM Thersia BROCKS wrote: Reason for CRM: Patient called in regarding her results , would like for a nurse to give her a call because she stated that her symptoms aren't getting any better    [phone]

## 2024-05-28 MED ORDER — CEFDINIR 300 MG PO CAPS: 300.0000 mg | ORAL_CAPSULE | Freq: Two times a day (BID) | ORAL | 0 refills | Status: AC

## 2024-05-30 ENCOUNTER — Encounter: Payer: Self-pay | Admitting: Internal Medicine

## 2024-05-30 DIAGNOSIS — N949 Unspecified condition associated with female genital organs and menstrual cycle: Secondary | ICD-10-CM | POA: Insufficient documentation

## 2024-05-30 NOTE — Assessment & Plan Note (Signed)
 Using estrogen cream. With vaginal discomfort, refer to PFPT for further evaluation and treatment.

## 2024-05-30 NOTE — Assessment & Plan Note (Signed)
Overall appears to be handling things relatively well.  Follow.   

## 2024-05-30 NOTE — Assessment & Plan Note (Signed)
 Using vaginal estrogen cream currently. Has f/u with urology scheduled. Discussed vaginal discomfort. Refer to PFPT.

## 2024-05-30 NOTE — Assessment & Plan Note (Signed)
 Recent calcium score 0. Continue diet and exercise. Desires not to take cholesterol medicaiton. Have discussed. Follow lipid panel.  The 10-year ASCVD risk score (Arnett DK, et al., 2019) is: 4.4%   Values used to calculate the score:     Age: 64 years     Clinically relevant sex: Female     Is Non-Hispanic African American: No     Diabetic: No     Tobacco smoker: No     Systolic Blood Pressure: 122 mmHg     Is BP treated: No     HDL Cholesterol: 65 mg/dL     Total Cholesterol: 218 mg/dL

## 2024-05-30 NOTE — Assessment & Plan Note (Signed)
 Check vitamin D level

## 2024-05-30 NOTE — Assessment & Plan Note (Signed)
 Was previously on wegovy . Lost weight. Off now. Maintaining weight loss. Follow met b and A1c. Request insulin level to be checked.

## 2024-05-30 NOTE — Assessment & Plan Note (Signed)
Has lost weight.  Continue diet and exercise.

## 2024-06-01 NOTE — Progress Notes (Unsigned)
 "    06/02/2024 9:16 PM   Meghan  L Valdez 1960-07-26 [id number]  Referring provider: Glendia Shad, MD [address] [place],  [place] [address]  Urological history: 1. 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 rUTI's - February 11, 2024-E. Coli  No chief complaint on file.  HPI: Meghan Valdez is a 64 y.o. woman who presents today for pelvic exam after starting vaginal estrogen cream.   Previous records reviewed.  She was seen by her PCP, Dr. Shad Glendia, on May 24, 2024 for dysuria.  UA was yellow slightly cloudy, specific gravity 1.010, pH 7.0, small leukocytes, nitrite positive, 7-10 WBCs, 0-2 RBCs, rare squames, and greater than 50 bacteria.  Urine culture still pending.  She has been using the vaginal estrogen cream.  Her PCP has also referred her to pelvic floor physical therapy.  Her pelvic exam was completed yesterday by her PCP and no abnormalities were noted.  Hemoglobin A1c of 5.2 in September.  Serum creatinine of 0.70, eGFR of 97 in September.   PMH: Past Medical History:  Diagnosis Date   Allergic rhinitis    Allergy    Arthritis    Basal cell carcinoma of skin    scalp- removed 1998   Chicken pox    Chronic sinusitis    Dysplastic nevus 07/30/2022   L mid back - mild   History of migraine headaches    Hypercholesterolemia    Migraines    Seizures (HCC) 1971    Surgical History: Past Surgical History:  Procedure Laterality Date   ABDOMINAL HYSTERECTOMY  2005   BREAST EXCISIONAL BIOPSY Left 1986   BREAST SURGERY  1986   Lumpectomy   CESAREAN SECTION  1992   COSMETIC SURGERY  1985   LAPAROSCOPIC CHOLECYSTECTOMY  2008   LIPOSUCTION     SEPTOPLASTY  1993   SKIN CANCER EXCISION  1998   basal cell carcinoma of the scalp   TOTAL ABDOMINAL HYSTERECTOMY W/ BILATERAL SALPINGOOPHORECTOMY     lysis of adhesions    Home Medications:  Allergies as of 06/02/2024       Reactions    Macrobid [nitrofurantoin] Hives, Swelling   Hives, lip swelling.   Sulfamethoxazole-trimethoprim Hives   Other Reaction(s): Not available   Sulfa Antibiotics Hives        Medication List        Accurate as of June 01, 2024  9:16 PM. If you have any questions, ask your nurse or doctor.          cefdinir  300 MG capsule Commonly known as: OMNICEF  Take 1 capsule (300 mg total) by mouth 2 (two) times daily.   estradiol  0.01 % Crea vaginal cream Commonly known as: ESTRACE  Apply one pea-sized amount around the opening of the urethra daily for 30 days, then 3 times weekly moving forward.   ibuprofen 600 MG tablet Commonly known as: ADVIL Take 600 mg by mouth every 6 (six) hours as needed.   Simethicone 125 MG Caps Take by mouth as needed.   triamcinolone  ointment 0.1 % Commonly known as: KENALOG  Apply 1 Application topically 2 (two) times daily as needed. Apply to aa, bilateral lower legs BID prn. Avoid applying to face, groin, and axilla. Use as directed. Long-term use can cause thinning of the skin.        Allergies:  Allergies  Allergen Reactions   Macrobid [Nitrofurantoin] Hives and Swelling    Hives, lip  swelling.   Sulfamethoxazole-Trimethoprim Hives    Other Reaction(s): Not available   Sulfa Antibiotics Hives    Family History: Family History  Problem Relation Age of Onset   Hypertension Mother    Hyperlipidemia Mother    Diabetes Mother    Rheumatic fever Father    Hearing loss Father    Hyperlipidemia Father    Breast cancer Maternal Aunt        great   Breast cancer Paternal Aunt        great   Hypertension Maternal Grandmother    Diabetes Brother    Hypertension Brother    Stroke Brother    Cancer Brother    Colon cancer Neg Hx     Social History:  reports that she has never smoked. She has never used smokeless tobacco. She reports current alcohol use. She reports that she does not use drugs.  ROS: Pertinent ROS in HPI  Physical  Exam: There were no vitals taken for this visit.  Constitutional:  Well nourished. Alert and oriented, No acute distress. HEENT: [place] AT, moist mucus membranes.  Trachea midline Cardiovascular: No clubbing, cyanosis, or edema. Respiratory: Normal respiratory effort, no increased work of breathing. Neurologic: Grossly intact, no focal deficits, moving all 4 extremities. Psychiatric: Normal mood and affect.    Laboratory Data: See Epic and HPI   I have reviewed the labs.   Pertinent Imaging:  03/02/24 15:23  Scan Result 0 ml    Assessment & Plan:    1. Genitourinary Syndrome of Menopause (GSM)  -  continue vaginal estrogen cream three nights weekly   2. rUTI's -  - criteria for recurrent UTI has been met with 2 or more infections in 6 months or 3 or greater infections in one year *** - continue vaginal estrogen cream as it is first-line therapy for postmenopausal women   - patient is instructed to increase their water intake until the urine is pale yellow or clear (10 to 12 cups daily) ***  - taking probiotics that include  Lactobacillus crispatus in premenopausal women and oral capsules with Lactobacillus rhamnosus GR-1 and Lactobacillus reuteri RC-14 in postmenopausal women ***  - Hiprex 1 gram twice daily if creatinine clearance is > 30 ***  - could consider D-mannose or cranberry products, but evidence is weak with contradictory findings ***  - address constipation issues ***  - if using tampons, she should remove them prior to urinating and change them often ***  Estrace  /Premarin  cream topically- peasized apply topically three times a week * Cetaphil to clean the genital area ( not soap)  * Cranberry supplement (-Knudsen cranberry concentrate- 1 ounce mixed with 8 ounces of water *wash with water after bowel movt *Probiotic for vaginal health( can try Pearls vaginal health) * Increase water consumption- 8 glasses a day *Avoid antibiotics unless systemic infection/ or before  cystoscopy * Kegel Exercise to strengthen pelvic floor                                                    No follow-ups on file.  These notes generated with voice recognition software. I apologize for typographical errors.  [name]  [facility] [address]  [address] [place], [place] [address] [phone]  "

## 2024-06-02 ENCOUNTER — Ambulatory Visit

## 2024-06-02 ENCOUNTER — Ambulatory Visit: Admitting: Urology

## 2024-06-02 VITALS — BP 111/69 | HR 71 | Wt 116.0 lb

## 2024-06-02 DIAGNOSIS — N952 Postmenopausal atrophic vaginitis: Secondary | ICD-10-CM | POA: Diagnosis not present

## 2024-06-02 DIAGNOSIS — N390 Urinary tract infection, site not specified: Secondary | ICD-10-CM | POA: Diagnosis not present

## 2024-06-02 DIAGNOSIS — N958 Other specified menopausal and perimenopausal disorders: Secondary | ICD-10-CM | POA: Diagnosis not present

## 2024-06-02 MED ORDER — ESTRADIOL 0.01 % VA CREA: TOPICAL_CREAM | VAGINAL | 12 refills | Status: AC

## 2024-06-02 NOTE — Patient Instructions (Signed)
 Recurrent UTI Prevention Strategies   Stay well hydrated. Get a moderate amount of exercise. Eat a diet rich in fruit and vegetables. Start a bowel regimen to manage your constipation. Your goal is to have consistent, formed bowel movements that are easy for you to pass. You may use either of the over-the-counter supplements Benefiber or Miralax to help with this. I recommend that you try Benefiber first and move on to Miralax if this is not helping you enough. You may adjust the recommended dose of Miralax (one capful daily) to achieve this goal. Start taking an over-the-counter cranberry supplement for urinary tract health. Take this once or twice daily on an empty stomach, e.g. right before bed. Start taking an over-the-counter d-mannose supplement. Take this daily per packaging instructions. Start taking an over-the-counter probiotic containing the bacterial species called Lactobacillus. Take this daily. Start vaginal estrogen cream. Apply a pea-sized amount around the opening of the urethra every day for 2 weeks, then three times weekly forever.

## 2024-06-03 NOTE — Telephone Encounter (Unsigned)
 Copied from CRM #[id number]. Topic: General - Other >> Jun 03, 2024  4:51 PM Delon T wrote: Reason for CRM: returning call,  started omnicef  and symptoms are improving, [facility] culture [facility] requ[facility] so she states it wont get read- [phone]

## 2024-06-15 LAB — SPECIMEN STATUS REPORT

## 2024-06-16 ENCOUNTER — Other Ambulatory Visit

## 2024-06-16 DIAGNOSIS — R8290 Unspecified abnormal findings in urine: Secondary | ICD-10-CM

## 2024-06-16 NOTE — Telephone Encounter (Signed)
 Spoke with pt, pain is completely gone but she reports that 3 days ago her urine began to have an odor.  Pt schedule to come in for urine sample today.  Orders pended for approval.

## 2024-06-16 NOTE — Addendum Note (Signed)
 Addended by: SEBASTIAN KNEE on: 06/16/2024 02:48 PM   Modules accepted: Orders

## 2024-06-16 NOTE — Telephone Encounter (Signed)
 Orders signed for urine and culture.

## 2024-06-16 NOTE — Addendum Note (Signed)
 Addended by: GLENDIA ALLENA RAMAN on: 06/16/2024 02:53 PM   Modules accepted: Orders

## 2024-06-17 LAB — URINALYSIS, ROUTINE W REFLEX MICROSCOPIC
Bilirubin, UA: NEGATIVE
Glucose, UA: NEGATIVE
Ketones, UA: NEGATIVE
Nitrite, UA: POSITIVE — AB
Protein,UA: NEGATIVE
RBC, UA: NEGATIVE
Specific Gravity, UA: 1.009 (ref 1.005–1.030)
Urobilinogen, Ur: 0.2 mg/dL (ref 0.2–1.0)
pH, UA: 6.5 (ref 5.0–7.5)

## 2024-06-17 LAB — MICROSCOPIC EXAMINATION
Casts: NONE SEEN /LPF
Epithelial Cells (non renal): NONE SEEN /HPF (ref 0–10)
RBC, Urine: NONE SEEN /HPF (ref 0–2)

## 2024-06-18 ENCOUNTER — Ambulatory Visit: Payer: Self-pay | Admitting: Internal Medicine

## 2024-06-18 LAB — URINE CULTURE

## 2024-08-03 ENCOUNTER — Ambulatory Visit: Admitting: Dermatology

## 2024-09-06 ENCOUNTER — Ambulatory Visit: Admitting: Internal Medicine

## 2024-11-30 ENCOUNTER — Ambulatory Visit
